# Patient Record
Sex: Male | Born: 1965 | Race: White | Hispanic: No | Marital: Married | Smoking: Never smoker
Health system: Southern US, Community
[De-identification: ages and names within clinical notes are randomized; demographics above are authoritative.]

## PROBLEM LIST (undated history)

## (undated) DIAGNOSIS — J40 Bronchitis, not specified as acute or chronic: Secondary | ICD-10-CM

## (undated) DIAGNOSIS — K219 Gastro-esophageal reflux disease without esophagitis: Secondary | ICD-10-CM

## (undated) DIAGNOSIS — E785 Hyperlipidemia, unspecified: Secondary | ICD-10-CM

## (undated) DIAGNOSIS — G4730 Sleep apnea, unspecified: Secondary | ICD-10-CM

## (undated) DIAGNOSIS — H6690 Otitis media, unspecified, unspecified ear: Secondary | ICD-10-CM

## (undated) DIAGNOSIS — G43909 Migraine, unspecified, not intractable, without status migrainosus: Secondary | ICD-10-CM

## (undated) DIAGNOSIS — F419 Anxiety disorder, unspecified: Secondary | ICD-10-CM

## (undated) DIAGNOSIS — Z9889 Other specified postprocedural states: Secondary | ICD-10-CM

## (undated) DIAGNOSIS — F32A Depression, unspecified: Secondary | ICD-10-CM

## (undated) DIAGNOSIS — I10 Essential (primary) hypertension: Secondary | ICD-10-CM

## (undated) HISTORY — PX: PILONIDAL CYST EXCISION: SHX744

## (undated) HISTORY — DX: Migraine, unspecified, not intractable, without status migrainosus: G43.909

---

## 2007-03-25 ENCOUNTER — Emergency Department (HOSPITAL_COMMUNITY): Admission: EM | Admit: 2007-03-25 | Discharge: 2007-03-25 | Payer: Self-pay | Admitting: Emergency Medicine

## 2007-12-03 ENCOUNTER — Emergency Department (HOSPITAL_COMMUNITY): Admission: EM | Admit: 2007-12-03 | Discharge: 2007-12-04 | Payer: Self-pay | Admitting: Emergency Medicine

## 2007-12-31 HISTORY — PX: FRACTURE SURGERY: SHX138

## 2008-03-05 ENCOUNTER — Inpatient Hospital Stay (HOSPITAL_COMMUNITY): Admission: EM | Admit: 2008-03-05 | Discharge: 2008-03-10 | Payer: Self-pay | Admitting: Emergency Medicine

## 2008-04-13 ENCOUNTER — Encounter: Admission: RE | Admit: 2008-04-13 | Discharge: 2008-07-12 | Payer: Self-pay | Admitting: Orthopedic Surgery

## 2010-06-26 ENCOUNTER — Encounter: Admission: RE | Admit: 2010-06-26 | Discharge: 2010-06-26 | Payer: Self-pay | Admitting: Gastroenterology

## 2010-11-02 ENCOUNTER — Ambulatory Visit
Admission: RE | Admit: 2010-11-02 | Discharge: 2010-11-02 | Payer: Self-pay | Source: Home / Self Care | Admitting: Orthopedic Surgery

## 2011-01-21 ENCOUNTER — Encounter: Payer: Self-pay | Admitting: Family Medicine
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
## 2011-09-23 LAB — CBC
HCT: 24.4 — ABNORMAL LOW
HCT: 24.8 — ABNORMAL LOW
Hemoglobin: 8.6 — ABNORMAL LOW
Hemoglobin: 8.6 — ABNORMAL LOW
MCHC: 35
MCHC: 35.1
MCHC: 35.4
MCV: 90.2
Platelets: 188
Platelets: 222
Platelets: 286
RBC: 2.72 — ABNORMAL LOW
RBC: 3.4 — ABNORMAL LOW
RBC: 4.35
RDW: 12.9
RDW: 12.9
RDW: 12.9
RDW: 13.1
WBC: 13.4 — ABNORMAL HIGH
WBC: 7.9

## 2011-09-23 LAB — BASIC METABOLIC PANEL
CO2: 27
Calcium: 7.7 — ABNORMAL LOW
Creatinine, Ser: 1.22
Sodium: 135

## 2011-09-23 LAB — CROSSMATCH: Antibody Screen: NEGATIVE

## 2011-09-23 LAB — I-STAT 8, (EC8 V) (CONVERTED LAB)
BUN: 10
Bicarbonate: 24.9 — ABNORMAL HIGH
Chloride: 102
HCT: 42
Hemoglobin: 14.3
Potassium: 3.9
TCO2: 26
pCO2, Ven: 42.4 — ABNORMAL LOW

## 2011-09-23 LAB — POCT I-STAT CREATININE: Operator id: 151321

## 2011-09-23 LAB — PROTIME-INR: INR: 1

## 2011-09-23 LAB — ABO/RH: ABO/RH(D): O NEG

## 2012-01-31 DIAGNOSIS — Z9889 Other specified postprocedural states: Secondary | ICD-10-CM

## 2012-01-31 HISTORY — DX: Other specified postprocedural states: Z98.890

## 2012-03-25 ENCOUNTER — Emergency Department (HOSPITAL_COMMUNITY)
Admission: EM | Admit: 2012-03-25 | Discharge: 2012-03-26 | Disposition: A | Discharge status: Home / Self Care | Payer: Managed Care, Other (non HMO) | Attending: Emergency Medicine | Admitting: Emergency Medicine

## 2012-03-25 ENCOUNTER — Emergency Department (HOSPITAL_COMMUNITY): Payer: Managed Care, Other (non HMO)

## 2012-03-25 ENCOUNTER — Encounter (HOSPITAL_COMMUNITY): Payer: Self-pay | Admitting: *Deleted

## 2012-03-25 DIAGNOSIS — R05 Cough: Secondary | ICD-10-CM | POA: Insufficient documentation

## 2012-03-25 DIAGNOSIS — J3489 Other specified disorders of nose and nasal sinuses: Secondary | ICD-10-CM | POA: Insufficient documentation

## 2012-03-25 DIAGNOSIS — R059 Cough, unspecified: Secondary | ICD-10-CM | POA: Insufficient documentation

## 2012-03-25 DIAGNOSIS — R0981 Nasal congestion: Secondary | ICD-10-CM

## 2012-03-25 DIAGNOSIS — R0602 Shortness of breath: Secondary | ICD-10-CM | POA: Insufficient documentation

## 2012-03-25 HISTORY — DX: Other specified postprocedural states: Z98.890

## 2012-03-25 HISTORY — DX: Bronchitis, not specified as acute or chronic: J40

## 2012-03-25 HISTORY — DX: Otitis media, unspecified, unspecified ear: H66.90

## 2012-03-25 LAB — POCT I-STAT, CHEM 8
Creatinine, Ser: 1.4 mg/dL — ABNORMAL HIGH (ref 0.50–1.35)
HCT: 47 % (ref 39.0–52.0)
Hemoglobin: 16 g/dL (ref 13.0–17.0)
Potassium: 4 mEq/L (ref 3.5–5.1)
Sodium: 138 mEq/L (ref 135–145)

## 2012-03-25 NOTE — ED Notes (Addendum)
C/o sob, worse when lying down, onset around 1900, LS CTA, speaking in clear complete sentences, "felt cold sx coming on for 2d", mentions sore throat & some nasal congestion & stomach does not feel well. (Denies: fever or nvd).  Sounds nasally congested with hoarse voice.

## 2012-03-26 LAB — CBC
MCH: 30.7 pg (ref 26.0–34.0)
Platelets: 299 10*3/uL (ref 150–400)
RBC: 4.89 MIL/uL (ref 4.22–5.81)
WBC: 9.2 10*3/uL (ref 4.0–10.5)

## 2012-03-26 LAB — DIFFERENTIAL
Eosinophils Absolute: 0.2 10*3/uL (ref 0.0–0.7)
Lymphocytes Relative: 30 % (ref 12–46)
Lymphs Abs: 2.8 10*3/uL (ref 0.7–4.0)
Neutrophils Relative %: 56 % (ref 43–77)

## 2012-03-26 MED ORDER — PREDNISONE 20 MG PO TABS
60.0000 mg | ORAL_TABLET | Freq: Once | ORAL | Status: AC
  Administered 2012-03-26: 60 mg via ORAL
  Filled 2012-03-26: qty 3

## 2012-03-26 MED ORDER — CETIRIZINE-PSEUDOEPHEDRINE ER 5-120 MG PO TB12: 1.0000 | ORAL_TABLET | Freq: Every day | ORAL | Status: AC

## 2012-03-26 MED ORDER — ALBUTEROL SULFATE HFA 108 (90 BASE) MCG/ACT IN AERS: 2.0000 | INHALATION_SPRAY | RESPIRATORY_TRACT | Status: AC | PRN

## 2012-03-26 NOTE — ED Provider Notes (Signed)
History     [name]: [id number]  Arrival date & time 03/25/12  2241   First MD Initiated Contact with Patient 03/26/12 0023      Chief Complaint  Patient presents with  . Shortness of Breath     HPI  History provided by the patient. Patient is a 46 year old male with no significant past medical history who presents with complaints of difficulty sleeping and awakening with cough and shortness of breath. She states that he began to feel some URI symptoms yesterday and today and this evening became more congested. No slight cough and states that when he is trying to fall asleep tonight he awakened shortly after falling asleep with feelings of shortness of breath and cough. Patient reports symptoms are slightly improved with sitting upright. Patient denies any significant cardiac history. He denies any swelling in extremities. He denies any persistent shortness of breath. He denies any chest pain or palpitations. he denies any fever, chills, sweats, nausea vomiting. Symptoms are described as severe. Patient denies any other aggravating or alleviating factors.    Past Medical History  Diagnosis Date  . Acute ear infection   . Bronchitis   . H/O removal of neck cyst 01/2012    R anterior neck I&D, with abx    Past Surgical History  Procedure Date  . Fracture surgery   . Pilonidal cyst excision     History reviewed. No pertinent family history.  History  Substance Use Topics  . Smoking status: Never Smoker   . Smokeless tobacco: Not on file  . Alcohol Use: No      Review of Systems  Constitutional: Negative for fever and chills.  Respiratory: Positive for cough and shortness of breath.   Cardiovascular: Negative for chest pain and palpitations.  Gastrointestinal: Negative for abdominal pain.    Allergies  Morphine and related and Penicillins  Home Medications   Current Outpatient Rx  Name Route Sig Dispense Refill  . ALBUTEROL SULFATE HFA 108 (90 BASE) MCG/ACT IN AERS  Inhalation Inhale 2 puffs into the lungs every 6 (six) hours as needed. For bronchitis    . AMITRIPTYLINE HCL 50 MG PO TABS Oral Take 50 mg by mouth at bedtime.    . ATORVASTATIN CALCIUM 20 MG PO TABS Oral Take 20 mg by mouth daily.    . BUPROPION HCL ER (SR) 100 MG PO TB12 Oral Take 100 mg by mouth 2 (two) times daily.    . DEXLANSOPRAZOLE 30 MG PO CPDR Oral Take 30 mg by mouth daily.    Marland Kitchen FLUTICASONE PROPIONATE 50 MCG/ACT NA SUSP Nasal Place 2 sprays into the nose daily. Ear and nasal congestion    . FENOFIBRATE 54 MG PO TABS Oral Take 54 mg by mouth daily.      BP 123/93  Pulse 73  Temp(Src) 97.5 F (36.4 C) (Oral)  Resp 14  SpO2 98%  Physical Exam  Nursing note and vitals reviewed. Constitutional: He is oriented to person, place, and time. He appears well-developed and well-nourished. No distress.  HENT:  Head: Normocephalic and atraumatic.  Mouth/Throat: Oropharynx is clear and moist.       Poor air movement through both nostrils. Edema of right nostril. Crusting of the nostrils.  Neck: Normal range of motion. Neck supple.  Cardiovascular: Normal rate and regular rhythm.   Pulmonary/Chest: Effort normal and breath sounds normal. No stridor. No respiratory distress. He has no wheezes. He has no rales.  Abdominal: Soft. He exhibits no distension. There is  no tenderness.  Musculoskeletal: He exhibits no edema and no tenderness.  Lymphadenopathy:    He has no cervical adenopathy.  Neurological: He is alert and oriented to person, place, and time.  Skin: Skin is warm. No rash noted.  Psychiatric: He has a normal mood and affect. His behavior is normal.    ED Course  Procedures   Results for orders placed during the hospital encounter of 03/25/12  CBC      Component Value Range   WBC 9.2  4.0 - 10.5 (K/uL)   RBC 4.89  4.22 - 5.81 (MIL/uL)   Hemoglobin 15.0  13.0 - 17.0 (g/dL)   HCT 16.1  09.6 - 04.5 (%)   MCV 88.3  78.0 - 100.0 (fL)   MCH 30.7  26.0 - 34.0 (pg)   MCHC  34.7  30.0 - 36.0 (g/dL)   RDW 40.9  81.1 - 91.4 (%)   Platelets 299  150 - 400 (K/uL)  DIFFERENTIAL      Component Value Range   Neutrophils Relative 56  43 - 77 (%)   Neutro Abs 5.1  1.7 - 7.7 (K/uL)   Lymphocytes Relative 30  12 - 46 (%)   Lymphs Abs 2.8  0.7 - 4.0 (K/uL)   Monocytes Relative 12  3 - 12 (%)   Monocytes Absolute 1.1 (*) 0.1 - 1.0 (K/uL)   Eosinophils Relative 2  0 - 5 (%)   Eosinophils Absolute 0.2  0.0 - 0.7 (K/uL)   Basophils Relative 1  0 - 1 (%)   Basophils Absolute 0.1  0.0 - 0.1 (K/uL)  POCT I-STAT, CHEM 8      Component Value Range   Sodium 138  135 - 145 (mEq/L)   Potassium 4.0  3.5 - 5.1 (mEq/L)   Chloride 102  96 - 112 (mEq/L)   BUN 17  6 - 23 (mg/dL)   Creatinine, Ser 7.82 (*) 0.50 - 1.35 (mg/dL)   Glucose, Bld 956 (*) 70 - 99 (mg/dL)   Calcium, Ion 2.13  0.86 - 1.32 (mmol/L)   TCO2 25  0 - 100 (mmol/L)   Hemoglobin 16.0  13.0 - 17.0 (g/dL)   HCT 57.8  46.9 - 62.9 (%)      Dg Chest 2 View  03/26/2012  *RADIOLOGY REPORT*  Clinical Data: Shortness of breath.  CHEST - 2 VIEW  Comparison: Chest radiograph performed 03/06/2008  Findings: The lungs are well-aerated and clear.  There is no evidence of focal opacification, pleural effusion or pneumothorax.  The heart is normal in size; the mediastinal contour is within normal limits.  No acute osseous abnormalities are seen.  IMPRESSION: No acute cardiopulmonary process seen.  Original Report Authenticated By: Tonia Ghent, M.D.     1. Nasal congestion       MDM  1:10 PM patient seen and evaluated. Patient no acute distress. Patient with normal respirations.  I discussed with patient the option to try Afrin for nasal congestion. Patient states he is afraid to not work for him he is tried and the past and does not want to try today. We also discussed possible breathing treatment the patient has no wheezing or significant respiratory complaints currently. At this time we'll give 1 dose of steroid and  recommend antihistamine and decongestant medication. Patient will plan to follow up with PCP.      Angus Seller, PA 03/26/12 1549  Medical screening examination/treatment/procedure(s) were performed by non-physician practitioner and as supervising physician I was immediately  available for consultation/collaboration.  Sunnie Nielsen, MD 03/27/12 [phone]

## 2012-03-26 NOTE — ED Notes (Signed)
Patient is AOx4 and comfortable with his discharge instructions. 

## 2012-03-26 NOTE — Discharge Instructions (Signed)
Please followup with your primary care provider later this week as discussed for continued evaluation and treatment of symptoms. You may find improvement of symptoms by sleeping in a recliner upright position. Return to the emergency room for any worsening shortness of breath, difficulty breathing or swallowing, fever, chills, sweats.   Upper Respiratory Infection, Adult An upper respiratory infection (URI) is also known as the common cold. It is often caused by a type of germ (virus). Colds are easily spread (contagious). You can pass it to others by kissing, coughing, sneezing, or drinking out of the same glass. Usually, you get better in 1 or 2 weeks.  HOME CARE   Only take medicine as told by your doctor.   Use a warm mist humidifier or breathe in steam from a hot shower.   Drink enough water and fluids to keep your pee (urine) clear or pale yellow.   Get plenty of rest.   Return to work when your temperature is back to normal or as told by your doctor. You may use a face mask and wash your hands to stop your cold from spreading.  GET HELP RIGHT AWAY IF:   After the first few days, you feel you are getting worse.   You have questions about your medicine.   You have chills, shortness of breath, or brown or red spit (mucus).   You have yellow or brown snot (nasal discharge) or pain in the face, especially when you bend forward.   You have a fever, puffy (swollen) neck, pain when you swallow, or white spots in the back of your throat.   You have a bad headache, ear pain, sinus pain, or chest pain.   You have a high-pitched whistling sound when you breathe in and out (wheezing).   You have a lasting cough or cough up blood.   You have sore muscles or a stiff neck.  MAKE SURE YOU:   Understand these instructions.   Will watch your condition.   Will get help right away if you are not doing well or get worse.  Document Released: 06/03/2008 Document Revised: 12/05/2011 Document  Reviewed: 04/22/2011 [facility] Patient Information 2012 [place], [place].  RESOURCE GUIDE  Dental Problems  Patients with Medicaid: [facility] [phone] [address]                                           [phone] [address] Phone:  [phone]                                                  Phone:  [phone]  If unable to pay or uninsured, contact:  [facility] or [facility] to become qualified for the adult dental clinic.  Chronic Pain Problems Contact [facility]  [phone] Patients need to be referred by their primary care doctor.  Insufficient Money for Medicine Contact [employer]:  call "211" or [facility] [phone] [phone].  No Primary Care Doctor Call [facility]  [phone] Other agencies that provide inexpensive medical care    [facility]  [phone]    [facility]  [phone]    [facility]  [phone]    [facility]  [phone]    [facility]  [phone]    [facility]  [phone]  Psychological Services [facility]  [phone] [facility]  [phone] [facility]   [phone] (emergency services [phone])  Substance Abuse Resources [facility]  [phone] [facility] [phone] [facility] [phone] Floydene Flock [phone] Residential & Outpatient Substance Abuse Program  [phone]  Abuse/Neglect [facility] Child Abuse Hotline (615) 461-2438 [facility] Child Abuse Hotline 252-248-8133 (After Hours)  Emergency Shelter [facility] 810-423-5409  [facility] [phone] [facility] [phone]  MRSA Hotline #:   402-585-6638    [facility]  [facility]     [employer]                          [facility]  De[address]. [address] [place]                       [address]      [address]  Blondell Reveal Phone:  [phone]                                   Phone:  [phone]                 Phone:  [phone]  [facility] Phone:  [phone]  [facility] Child Abuse Hotline 321-496-6019 [phone] (After Hours)

## 2014-06-24 ENCOUNTER — Ambulatory Visit
Admission: RE | Admit: 2014-06-24 | Discharge: 2014-06-24 | Disposition: A | Discharge status: Home / Self Care | Payer: Managed Care, Other (non HMO) | Source: Ambulatory Visit | Attending: Emergency Medicine | Admitting: Emergency Medicine

## 2014-06-24 ENCOUNTER — Other Ambulatory Visit: Payer: Self-pay | Admitting: Emergency Medicine

## 2014-06-24 DIAGNOSIS — M25551 Pain in right hip: Secondary | ICD-10-CM

## 2014-06-24 DIAGNOSIS — M545 Low back pain, unspecified: Secondary | ICD-10-CM

## 2016-05-09 ENCOUNTER — Ambulatory Visit: Payer: Self-pay | Admitting: Otolaryngology

## 2016-05-10 ENCOUNTER — Encounter (HOSPITAL_BASED_OUTPATIENT_CLINIC_OR_DEPARTMENT_OTHER): Payer: Self-pay | Admitting: *Deleted

## 2016-05-10 NOTE — H&P (Signed)
Otolaryngology Office Note  HPI:   Jim OlszewskiRobert Smith is a 50 y.o. male who presents as a consult patient with a chief complaint of Right side floor of mouth swelling. He has had this intermittently for about 3 months. It will swell up, and then eventually drained sour liquid material. He hasn't really had much pain except when he gets large enough that he starts to bite it. There is no antecedent trauma or infection in the area. His dental evaluation has been negative. He has a history of reflux and has been on medication for years. He drinks at least 4 servings of caffeine daily. He has been having thick postnasal drainage recently especially at night.   PMH/Meds/All/SocHx/FamHx/ROS:    Past Medical History       Past Medical History:  Diagnosis Date  . High cholesterol   . OSA (obstructive sleep apnea)        Past Surgical History        Past Surgical History:  Procedure Laterality Date  . LEG SURGERY    . WISDOM TOOTH EXTRACTION        No family history of bleeding disorders, wound healing problems or difficulty with anesthesia.    Social History   Social History        Social History  . Marital status: Married    Spouse name: N/A  . Number of children: N/A  . Years of education: N/A      Occupational History  . Not on file.       Social History Main Topics  . Smoking status: Never Smoker  . Smokeless tobacco: Never Used  . Alcohol use No  . Drug use: No  . Sexual activity: Not on file       Other Topics Concern  . Not on file      Social History Narrative  . No narrative on file       Current Outpatient Prescriptions:  . amitriptyline (ELAVIL) 10 MG tablet, TAKE 1 TABLET DAILY, Disp: , Rfl: 2 . atorvastatin (LIPITOR) 10 MG tablet, TAKE 1 TABLET IN THE EVENING, Disp: , Rfl: 0 . buPROPion (WELLBUTRIN SR) 200 MG 12 hr tablet, TAKE ONE TABLET (200 MG TOTAL) BY MOUTH 2 (TWO) TIMES DAILY., Disp: , Rfl: 3 . DEXILANT 60 mg capsule,  TAKE ONE CAPSULE BY MOUTH EVERY DAY, Disp: , Rfl: 2 . fenofibrate (LOFIBRA) 160 MG tablet, TAKE 1 TABLET DAILY, Disp: , Rfl: 1  A complete ROS was performed with pertinent positives/negatives noted in the HPI. The remainder of the ROS are negative.   Physical Exam:    BP 130/81  Ht 1.88 m (6\' 2" )  Wt 98.9 kg (218 lb)  BMI 27.99 kg/m2   General: Healthy and alert, in no distress, breathing easily. Normal affect. In a pleasant mood. Head: Normocephalic, atraumatic. No masses, or scars. Eyes: Pupils are equal, and reactive to light. Vision is grossly intact. No spontaneous or gaze nystagmus. Ears: Ear canals are clear. Tympanic membranes are intact, with normal landmarks and the middle ears are clear and healthy. Hearing: Grossly normal. Nose. Nasal cavities are clear with healthy mucosa, no polyps or exudate.Airways are patent. Face: No masses or scars, facial nerve function is symmetric. Oral Cavity: Soft liquidy swelling right floor of mouth. Does not involve the submandibular gland. Tongue with normal mobility. Dentition appears healthy. Oropharynx: Tonsils are symmetric. There are no mucosal masses identified. Tongue base appears normal and healthy. Larynx/Hypopharynx: deferred Neck: No palpable masses, no cervical  adenopathy, no thyroid nodules or enlargement. Neuro: Cranial nerves II-XII will normal function. Balance: Normal gate. Other findings: none.   Independent Review of Additional Tests or Records:  none  Procedures:  none   Impression & Plans:  Jim Smith is a 50 y.o. male with Ranula. Recommend surgical excision of the right sublingual gland. This is typically curative for this problem. This will be done as an outpatient procedure under anesthesia. He is also suffering with reflux.

## 2016-05-17 ENCOUNTER — Ambulatory Visit (HOSPITAL_BASED_OUTPATIENT_CLINIC_OR_DEPARTMENT_OTHER)
Admission: RE | Admit: 2016-05-17 | Discharge: 2016-05-17 | Disposition: A | Discharge status: Home / Self Care | Payer: Managed Care, Other (non HMO) | Source: Ambulatory Visit | Attending: Otolaryngology | Admitting: Otolaryngology

## 2016-05-17 ENCOUNTER — Ambulatory Visit (HOSPITAL_BASED_OUTPATIENT_CLINIC_OR_DEPARTMENT_OTHER): Payer: Managed Care, Other (non HMO) | Admitting: Anesthesiology

## 2016-05-17 ENCOUNTER — Encounter (HOSPITAL_BASED_OUTPATIENT_CLINIC_OR_DEPARTMENT_OTHER)
Admission: RE | Disposition: A | Discharge status: Home / Self Care | Payer: Self-pay | Source: Ambulatory Visit | Attending: Otolaryngology

## 2016-05-17 ENCOUNTER — Encounter (HOSPITAL_BASED_OUTPATIENT_CLINIC_OR_DEPARTMENT_OTHER): Payer: Self-pay | Admitting: *Deleted

## 2016-05-17 DIAGNOSIS — E7800 Pure hypercholesterolemia, unspecified: Secondary | ICD-10-CM | POA: Diagnosis not present

## 2016-05-17 DIAGNOSIS — Z79899 Other long term (current) drug therapy: Secondary | ICD-10-CM | POA: Diagnosis not present

## 2016-05-17 DIAGNOSIS — K134 Granuloma and granuloma-like lesions of oral mucosa: Secondary | ICD-10-CM | POA: Insufficient documentation

## 2016-05-17 DIAGNOSIS — G4733 Obstructive sleep apnea (adult) (pediatric): Secondary | ICD-10-CM | POA: Insufficient documentation

## 2016-05-17 DIAGNOSIS — G4730 Sleep apnea, unspecified: Secondary | ICD-10-CM | POA: Insufficient documentation

## 2016-05-17 HISTORY — DX: Sleep apnea, unspecified: G47.30

## 2016-05-17 HISTORY — DX: Hyperlipidemia, unspecified: E78.5

## 2016-05-17 HISTORY — DX: Anxiety disorder, unspecified: F41.9

## 2016-05-17 HISTORY — PX: SUBMANDIBULAR GLAND EXCISION: SHX2456

## 2016-05-17 HISTORY — DX: Gastro-esophageal reflux disease without esophagitis: K21.9

## 2016-05-17 SURGERY — EXCISION, SUBMANDIBULAR GLAND
Anesthesia: General | Laterality: Right

## 2016-05-17 MED ORDER — LIDOCAINE-EPINEPHRINE 1 %-1:100000 IJ SOLN
INTRAMUSCULAR | Status: DC | PRN
  Administered 2016-05-17: 2 mL

## 2016-05-17 MED ORDER — LIDOCAINE 2% (20 MG/ML) 5 ML SYRINGE
INTRAMUSCULAR | Status: AC
  Filled 2016-05-17: qty 5

## 2016-05-17 MED ORDER — ACETAMINOPHEN 325 MG PO TABS
650.0000 mg | ORAL_TABLET | Freq: Four times a day (QID) | ORAL | Status: DC | PRN
  Administered 2016-05-17: 650 mg via ORAL

## 2016-05-17 MED ORDER — FENTANYL CITRATE (PF) 100 MCG/2ML IJ SOLN
INTRAMUSCULAR | Status: AC
  Filled 2016-05-17: qty 2

## 2016-05-17 MED ORDER — SUCCINYLCHOLINE CHLORIDE 200 MG/10ML IV SOSY
PREFILLED_SYRINGE | INTRAVENOUS | Status: AC
  Filled 2016-05-17: qty 10

## 2016-05-17 MED ORDER — PROMETHAZINE HCL 25 MG/ML IJ SOLN
INTRAMUSCULAR | Status: AC
  Filled 2016-05-17: qty 1

## 2016-05-17 MED ORDER — HYDROMORPHONE HCL 1 MG/ML IJ SOLN
INTRAMUSCULAR | Status: AC
  Filled 2016-05-17: qty 1

## 2016-05-17 MED ORDER — MIDAZOLAM HCL 2 MG/2ML IJ SOLN
1.0000 mg | INTRAMUSCULAR | Status: DC | PRN
  Administered 2016-05-17: 2 mg via INTRAVENOUS

## 2016-05-17 MED ORDER — MIDAZOLAM HCL 2 MG/2ML IJ SOLN
INTRAMUSCULAR | Status: AC
  Filled 2016-05-17: qty 2

## 2016-05-17 MED ORDER — PHENYLEPHRINE 40 MCG/ML (10ML) SYRINGE FOR IV PUSH (FOR BLOOD PRESSURE SUPPORT)
PREFILLED_SYRINGE | INTRAVENOUS | Status: AC
  Filled 2016-05-17: qty 10

## 2016-05-17 MED ORDER — SUCCINYLCHOLINE CHLORIDE 20 MG/ML IJ SOLN
INTRAMUSCULAR | Status: DC | PRN
  Administered 2016-05-17: 100 mg via INTRAVENOUS

## 2016-05-17 MED ORDER — HYDROMORPHONE HCL 1 MG/ML IJ SOLN
0.2500 mg | INTRAMUSCULAR | Status: DC | PRN
  Administered 2016-05-17 (×4): 0.5 mg via INTRAVENOUS

## 2016-05-17 MED ORDER — LACTATED RINGERS IV SOLN
INTRAVENOUS | Status: DC
  Administered 2016-05-17: 10:00:00 via INTRAVENOUS
  Administered 2016-05-17: 10 mL/h via INTRAVENOUS

## 2016-05-17 MED ORDER — SCOPOLAMINE 1 MG/3DAYS TD PT72
MEDICATED_PATCH | TRANSDERMAL | Status: AC
  Filled 2016-05-17: qty 1

## 2016-05-17 MED ORDER — PHENYLEPHRINE 40 MCG/ML (10ML) SYRINGE FOR IV PUSH (FOR BLOOD PRESSURE SUPPORT)
PREFILLED_SYRINGE | INTRAVENOUS | Status: DC | PRN
Start: 2016-05-17 — End: 2016-05-17
  Administered 2016-05-17: 80 ug via INTRAVENOUS

## 2016-05-17 MED ORDER — FENTANYL CITRATE (PF) 100 MCG/2ML IJ SOLN
INTRAMUSCULAR | Status: AC
Start: 2016-05-17 — End: 2016-05-17
  Filled 2016-05-17: qty 2

## 2016-05-17 MED ORDER — SUGAMMADEX SODIUM 200 MG/2ML IV SOLN
INTRAVENOUS | Status: DC | PRN
  Administered 2016-05-17: 200 mg via INTRAVENOUS

## 2016-05-17 MED ORDER — GLYCOPYRROLATE 0.2 MG/ML IJ SOLN: 0.2000 mg | Freq: Once | INTRAMUSCULAR | Status: DC | PRN

## 2016-05-17 MED ORDER — OXYCODONE HCL 5 MG PO TABS
5.0000 mg | ORAL_TABLET | Freq: Once | ORAL | Status: AC | PRN
  Administered 2016-05-17: 5 mg via ORAL

## 2016-05-17 MED ORDER — CLINDAMYCIN HCL 300 MG PO CAPS: 300.0000 mg | ORAL_CAPSULE | Freq: Three times a day (TID) | ORAL | Status: DC

## 2016-05-17 MED ORDER — ONDANSETRON HCL 4 MG/2ML IJ SOLN
INTRAMUSCULAR | Status: DC | PRN
  Administered 2016-05-17: 4 mg via INTRAVENOUS

## 2016-05-17 MED ORDER — ROCURONIUM 10MG/ML (10ML) SYRINGE FOR MEDFUSION PUMP - OPTIME
INTRAVENOUS | Status: DC | PRN
  Administered 2016-05-17: 20 mg via INTRAVENOUS
  Administered 2016-05-17: 30 mg via INTRAVENOUS

## 2016-05-17 MED ORDER — MEPERIDINE HCL 25 MG/ML IJ SOLN: 6.2500 mg | INTRAMUSCULAR | Status: DC | PRN

## 2016-05-17 MED ORDER — ONDANSETRON HCL 4 MG/2ML IJ SOLN
INTRAMUSCULAR | Status: AC
  Filled 2016-05-17: qty 2

## 2016-05-17 MED ORDER — PROPOFOL 10 MG/ML IV BOLUS
INTRAVENOUS | Status: DC | PRN
  Administered 2016-05-17: 200 mg via INTRAVENOUS

## 2016-05-17 MED ORDER — PROMETHAZINE HCL 25 MG/ML IJ SOLN
6.2500 mg | Freq: Once | INTRAMUSCULAR | Status: AC | PRN
  Administered 2016-05-17: 6.25 mg via INTRAVENOUS

## 2016-05-17 MED ORDER — SCOPOLAMINE 1 MG/3DAYS TD PT72
1.0000 | MEDICATED_PATCH | Freq: Once | TRANSDERMAL | Status: DC | PRN
  Administered 2016-05-17: 1.5 mg via TRANSDERMAL

## 2016-05-17 MED ORDER — CLINDAMYCIN PHOSPHATE 900 MG/50ML IV SOLN
900.0000 mg | INTRAVENOUS | Status: AC
  Administered 2016-05-17: 900 mg via INTRAVENOUS

## 2016-05-17 MED ORDER — DEXAMETHASONE SODIUM PHOSPHATE 10 MG/ML IJ SOLN
INTRAMUSCULAR | Status: AC
  Filled 2016-05-17: qty 1

## 2016-05-17 MED ORDER — OXYCODONE HCL 5 MG/5ML PO SOLN: 5.0000 mg | Freq: Once | ORAL | Status: AC | PRN

## 2016-05-17 MED ORDER — FENTANYL CITRATE (PF) 100 MCG/2ML IJ SOLN
50.0000 ug | INTRAMUSCULAR | Status: DC | PRN
  Administered 2016-05-17: 50 ug via INTRAVENOUS
  Administered 2016-05-17: 100 ug via INTRAVENOUS

## 2016-05-17 MED ORDER — SUGAMMADEX SODIUM 200 MG/2ML IV SOLN
INTRAVENOUS | Status: AC
  Filled 2016-05-17: qty 2

## 2016-05-17 MED ORDER — ACETAMINOPHEN 325 MG PO TABS
ORAL_TABLET | ORAL | Status: AC
  Filled 2016-05-17: qty 2

## 2016-05-17 MED ORDER — HYDROCODONE-ACETAMINOPHEN 7.5-325 MG PO TABS
1.0000 | ORAL_TABLET | Freq: Four times a day (QID) | ORAL | Status: DC | PRN
Start: 2016-05-17 — End: 2017-08-04

## 2016-05-17 MED ORDER — PROMETHAZINE HCL 25 MG RE SUPP: 25.0000 mg | Freq: Four times a day (QID) | RECTAL | Status: DC | PRN

## 2016-05-17 MED ORDER — OXYCODONE HCL 5 MG PO TABS
ORAL_TABLET | ORAL | Status: AC
  Filled 2016-05-17: qty 1

## 2016-05-17 MED ORDER — LIDOCAINE HCL (CARDIAC) 20 MG/ML IV SOLN
INTRAVENOUS | Status: DC | PRN
  Administered 2016-05-17: 80 mg via INTRAVENOUS

## 2016-05-17 MED ORDER — DEXAMETHASONE SODIUM PHOSPHATE 4 MG/ML IJ SOLN
INTRAMUSCULAR | Status: DC | PRN
  Administered 2016-05-17: 10 mg via INTRAVENOUS

## 2016-05-17 MED ORDER — PROPOFOL 10 MG/ML IV BOLUS
INTRAVENOUS | Status: AC
  Filled 2016-05-17: qty 20

## 2016-05-17 MED ORDER — CLINDAMYCIN PHOSPHATE 900 MG/50ML IV SOLN
INTRAVENOUS | Status: AC
  Filled 2016-05-17: qty 50

## 2016-05-17 MED ORDER — ATROPINE SULFATE 0.4 MG/ML IJ SOLN
INTRAMUSCULAR | Status: AC
  Filled 2016-05-17: qty 1

## 2016-05-17 SURGICAL SUPPLY — 62 items
ADH SKN CLS APL DERMABOND .7 (GAUZE/BANDAGES/DRESSINGS)
APL SKNCLS STERI-STRIP NONHPOA (GAUZE/BANDAGES/DRESSINGS)
ATTRACTOMAT 16X20 MAGNETIC DRP (DRAPES) IMPLANT
BENZOIN TINCTURE PRP APPL 2/3 (GAUZE/BANDAGES/DRESSINGS) IMPLANT
BLADE SURG 15 STRL LF DISP TIS (BLADE) ×1 IMPLANT
BLADE SURG 15 STRL SS (BLADE) ×2
CANISTER SUCT 1200ML W/VALVE (MISCELLANEOUS) ×2 IMPLANT
CLEANER CAUTERY TIP 5X5 PAD (MISCELLANEOUS) ×1 IMPLANT
CLIP TI MEDIUM 6 (CLIP) IMPLANT
CLIP TI WIDE RED SMALL 6 (CLIP) IMPLANT
CORDS BIPOLAR (ELECTRODE) ×1 IMPLANT
COVER BACK TABLE 60X90IN (DRAPES) ×2 IMPLANT
COVER MAYO STAND STRL (DRAPES) ×2 IMPLANT
DERMABOND ADVANCED (GAUZE/BANDAGES/DRESSINGS)
DERMABOND ADVANCED .7 DNX12 (GAUZE/BANDAGES/DRESSINGS) IMPLANT
DRAIN JACKSON RD 7FR 3/32 (WOUND CARE) IMPLANT
DRAIN PENROSE 1/4X12 LTX STRL (WOUND CARE) IMPLANT
DRAPE MICROSCOPE URBAN (DRAPES) ×1 IMPLANT
DRAPE U-SHAPE 76X120 STRL (DRAPES) ×1 IMPLANT
ELECT COATED BLADE 2.86 ST (ELECTRODE) ×2 IMPLANT
ELECT REM PT RETURN 9FT ADLT (ELECTROSURGICAL) ×2
ELECTRODE REM PT RTRN 9FT ADLT (ELECTROSURGICAL) ×1 IMPLANT
EVACUATOR SILICONE 100CC (DRAIN) IMPLANT
FORCEPS BIPOLAR SPETZLER 8 1.0 (NEUROSURGERY SUPPLIES) ×1 IMPLANT
GAUZE SPONGE 4X4 16PLY XRAY LF (GAUZE/BANDAGES/DRESSINGS) IMPLANT
GLOVE BIO SURGEON STRL SZ 6 (GLOVE) ×1 IMPLANT
GLOVE BIOGEL PI IND STRL 6.5 (GLOVE) IMPLANT
GLOVE BIOGEL PI IND STRL 7.0 (GLOVE) IMPLANT
GLOVE BIOGEL PI INDICATOR 6.5 (GLOVE) ×1
GLOVE BIOGEL PI INDICATOR 7.0 (GLOVE) ×1
GLOVE ECLIPSE 6.5 STRL STRAW (GLOVE) ×2 IMPLANT
GLOVE ECLIPSE 7.5 STRL STRAW (GLOVE) ×2 IMPLANT
GOWN STRL REUS W/ TWL LRG LVL3 (GOWN DISPOSABLE) ×1 IMPLANT
GOWN STRL REUS W/TWL LRG LVL3 (GOWN DISPOSABLE) ×6
NDL HYPO 25X1 1.5 SAFETY (NEEDLE) IMPLANT
NDL PRECISIONGLIDE 27X1.5 (NEEDLE) IMPLANT
NEEDLE HYPO 25X1 1.5 SAFETY (NEEDLE) IMPLANT
NEEDLE PRECISIONGLIDE 27X1.5 (NEEDLE) ×2 IMPLANT
NS IRRIG 1000ML POUR BTL (IV SOLUTION) ×1 IMPLANT
PACK BASIN DAY SURGERY FS (CUSTOM PROCEDURE TRAY) ×2 IMPLANT
PAD CLEANER CAUTERY TIP 5X5 (MISCELLANEOUS) ×1
PENCIL FOOT CONTROL (ELECTRODE) ×2 IMPLANT
RUBBERBAND STERILE (MISCELLANEOUS) IMPLANT
SHEET MEDIUM DRAPE 40X70 STRL (DRAPES) ×1 IMPLANT
SLEEVE SCD COMPRESS KNEE MED (MISCELLANEOUS) IMPLANT
SPONGE GAUZE 2X2 8PLY STRL LF (GAUZE/BANDAGES/DRESSINGS) IMPLANT
SPONGE GAUZE 4X4 12PLY STER LF (GAUZE/BANDAGES/DRESSINGS) IMPLANT
STAPLER VISISTAT 35W (STAPLE) IMPLANT
STRIP CLOSURE SKIN 1/2X4 (GAUZE/BANDAGES/DRESSINGS) IMPLANT
SUCTION FRAZIER HANDLE 10FR (MISCELLANEOUS)
SUCTION TUBE FRAZIER 10FR DISP (MISCELLANEOUS) IMPLANT
SUT CHROMIC 3 0 PS 2 (SUTURE) IMPLANT
SUT CHROMIC 4 0 P 3 18 (SUTURE) ×1 IMPLANT
SUT ETHILON 4 0 PS 2 18 (SUTURE) IMPLANT
SUT ETHILON 5 0 P 3 18 (SUTURE)
SUT NYLON ETHILON 5-0 P-3 1X18 (SUTURE) IMPLANT
SUT PLAIN 5 0 P 3 18 (SUTURE) ×1 IMPLANT
SUT SILK 4 0 TIES 17X18 (SUTURE) IMPLANT
SYR BULB 3OZ (MISCELLANEOUS) ×1 IMPLANT
SYR CONTROL 10ML LL (SYRINGE) ×1 IMPLANT
TRAY DSU PREP LF (CUSTOM PROCEDURE TRAY) ×1 IMPLANT
TUBE CONNECTING 20X1/4 (TUBING) ×2 IMPLANT

## 2016-05-17 NOTE — Anesthesia Postprocedure Evaluation (Signed)
Anesthesia Post Note  Patient: Jim OlszewskiRobert Bolls  Procedure(s) Performed: Procedure(s) (LRB): INTRA ORAL EXCISION RIGHT SUBLINGUAL GLAND (Right)  Patient location during evaluation: PACU Anesthesia Type: General Level of consciousness: awake and alert Pain management: pain level controlled Vital Signs Assessment: post-procedure vital signs reviewed and stable Respiratory status: spontaneous breathing, nonlabored ventilation and respiratory function stable Cardiovascular status: blood pressure returned to baseline and stable Postop Assessment: no signs of nausea or vomiting Anesthetic complications: no    Last Vitals:  Filed Vitals:   05/17/16 1215 05/17/16 1245  BP: 127/73 146/71  Pulse: 74 74  Temp:  36.7 C  Resp: 18 16    Last Pain:  Filed Vitals:   05/17/16 1256  PainSc: 4                  Chaze Hruska A

## 2016-05-17 NOTE — Anesthesia Preprocedure Evaluation (Signed)
Anesthesia Evaluation  Patient identified by MRN, date of birth, ID band Patient awake    Reviewed: Allergy & Precautions, NPO status , Patient's Chart, lab work & pertinent test results  Airway Mallampati: I  TM Distance: >3 FB Neck ROM: Full    Dental  (+) Teeth Intact, Dental Advisory Given   Pulmonary sleep apnea and Continuous Positive Airway Pressure Ventilation ,    breath sounds clear to auscultation       Cardiovascular  Rhythm:Regular Rate:Normal     Neuro/Psych    GI/Hepatic   Endo/Other    Renal/GU      Musculoskeletal   Abdominal   Peds  Hematology   Anesthesia Other Findings   Reproductive/Obstetrics                             Anesthesia Physical Anesthesia Plan  ASA: II  Anesthesia Plan: General   Post-op Pain Management:    Induction: Intravenous  Airway Management Planned: Oral ETT  Additional Equipment:   Intra-op Plan:   Post-operative Plan: Extubation in OR  Informed Consent: I have reviewed the patients History and Physical, chart, labs and discussed the procedure including the risks, benefits and alternatives for the proposed anesthesia with the patient or authorized representative who has indicated his/her understanding and acceptance.   Dental advisory given  Plan Discussed with: CRNA, Anesthesiologist and Surgeon  Anesthesia Plan Comments:         Anesthesia Quick Evaluation

## 2016-05-17 NOTE — Anesthesia Procedure Notes (Signed)
Procedure Name: Intubation Date/Time: 05/17/2016 9:18 AM Performed by: Jim GibbonKEETON, Jim Braddock S Pre-anesthesia Checklist: Patient identified, Emergency Drugs available, Suction available and Patient being monitored Patient Re-evaluated:Patient Re-evaluated prior to inductionOxygen Delivery Method: Circle System Utilized Preoxygenation: Pre-oxygenation with 100% oxygen Intubation Type: IV induction Ventilation: Mask ventilation without difficulty Laryngoscope Size: Miller and 3 Grade View: Grade III Tube type: Oral Tube size: 8.0 mm Number of attempts: 1 Airway Equipment and Method: Stylet and Oral airway Placement Confirmation: ETT inserted through vocal cords under direct vision,  positive ETCO2 and breath sounds checked- equal and bilateral Tube secured with: Tape Dental Injury: Teeth and Oropharynx as per pre-operative assessment

## 2016-05-17 NOTE — Discharge Instructions (Signed)
Rinse mouth with saline 3 times daily. Resume regular diet and brush teeth as you would normally would.  Call your surgeon if you experience:   1.  Fever over 101.0. 2.  Inability to urinate. 3.  Nausea and/or vomiting. 4.  Extreme swelling or bruising at the surgical site. 5.  Continued bleeding from the incision. 6.  Increased pain, redness or drainage from the incision. 7.  Problems related to your pain medication. 8.  Any problems and/or concerns   Post Anesthesia Home Care Instructions  Activity: Get plenty of rest for the remainder of the day. A responsible adult should stay with you for 24 hours following the procedure.  For the next 24 hours, DO NOT: -Drive a car -Advertising copywriterperate machinery -Drink alcoholic beverages -Take any medication unless instructed by your physician -Make any legal decisions or sign important papers.  Meals: Start with liquid foods such as gelatin or soup. Progress to regular foods as tolerated. Avoid greasy, spicy, heavy foods. If nausea and/or vomiting occur, drink only clear liquids until the nausea and/or vomiting subsides. Call your physician if vomiting continues.  Special Instructions/Symptoms: Your throat may feel dry or sore from the anesthesia or the breathing tube placed in your throat during surgery. If this causes discomfort, gargle with warm salt water. The discomfort should disappear within 24 hours.  If you had a scopolamine patch placed behind your ear for the management of post- operative nausea and/or vomiting:  1. The medication in the patch is effective for 72 hours, after which it should be removed.  Wrap patch in a tissue and discard in the trash. Wash hands thoroughly with soap and water. 2. You may remove the patch earlier than 72 hours if you experience unpleasant side effects which may include dry mouth, dizziness or visual disturbances. 3. Avoid touching the patch. Wash your hands with soap and water after contact with the patch.

## 2016-05-17 NOTE — Transfer of Care (Signed)
Immediate Anesthesia Transfer of Care Note  Patient: Jim Smith  Procedure(s) Performed: Procedure(s): EXCISION RIGHT SUBLINGUAL GLAND (Right)  Patient Location: PACU  Anesthesia Type:General  Level of Consciousness: sedated and responds to stimulation  Airway & Oxygen Therapy: Patient Spontanous Breathing and Patient connected to face mask oxygen  Post-op Assessment: Report given to RN and Post -op Vital signs reviewed and stable  Post vital signs: Reviewed and stable  Last Vitals:  Filed Vitals:   05/17/16 0828 05/17/16 1057  BP: 144/86   Pulse: 66 77  Temp: 36.5 C   Resp: 18 11    Last Pain: There were no vitals filed for this visit.       Complications: No apparent anesthesia complications

## 2016-05-17 NOTE — Op Note (Signed)
OPERATIVE REPORT  DATE OF SURGERY: 05/17/2016  PATIENT:  Lula Olszewskiobert Mazzeo,  50 y.o. male  PRE-OPERATIVE DIAGNOSIS:  granuloma on floor of mouth  POST-OPERATIVE DIAGNOSIS:  granuloma on floor of mouth  PROCEDURE:  Procedure(s): EXCISION RIGHT SUBLINGUAL GLAND  SURGEON:  Susy FrizzleJefry H Kevontay Burks, MD  ASSISTANTS: None  ANESTHESIA:   General   EBL:  10 ml  DRAINS: None  LOCAL MEDICATIONS USED:  1% Xylocaine with epinephrine  SPECIMEN:  Right sublingual gland  COUNTS:  Correct  PROCEDURE DETAILS: The patient was taken to the operating room and placed on the operating table in the supine position. Following induction of general endotracheal anesthesia, the mouth was draped in a standard fashion. A left sided bite-block was used. Local anesthetic was infiltrated into the floor of mouth on the right side. Using the operating microscope, the submandibular puncta was cannulated and dilated up to a #4 lacrimal probe. Electrocautery was used to create a linear incision along the lateral floor of mouth on the right. The sublingual gland was exposed. Sharp and cautery dissection was then used to dissected around the sublingual gland to remove the entire gland. The submandibular duct was rerouted and the edges were reapproximated to the new coastal incision in 4 quadrants. Bipolar cautery was used for hemostasis. The lingual nerve was identified and preserved. The incision was reapproximated with running chromic suture. 5-0 plain gut was used to reapproximate the edges of the submandibular duct. The mouth was irrigated with saline and suctioned. The patient was awakened, extubated and transferred to recovery in stable condition.    PATIENT DISPOSITION:  To PACU, stable

## 2016-05-17 NOTE — Interval H&P Note (Signed)
History and Physical Interval Note:  05/17/2016 8:42 AM  Jim Smith  has presented today for surgery, with the diagnosis of granuloma on floor of mouth  The various methods of treatment have been discussed with the patient and family. After consideration of risks, benefits and other options for treatment, the patient has consented to  Procedure(s): EXCISION SUBlingual GLAND (N/A) as a surgical intervention .  The patient's history has been reviewed, patient examined, no change in status, stable for surgery.  I have reviewed the patient's chart and labs.  Questions were answered to the patient's satisfaction.     Dynasty Holquin

## 2016-05-19 ENCOUNTER — Encounter (HOSPITAL_COMMUNITY): Payer: Self-pay | Admitting: *Deleted

## 2016-05-19 ENCOUNTER — Observation Stay (HOSPITAL_COMMUNITY)
Admission: EM | Admit: 2016-05-19 | Discharge: 2016-05-20 | Disposition: A | Discharge status: Home / Self Care | Payer: Managed Care, Other (non HMO) | Attending: Otolaryngology | Admitting: Otolaryngology

## 2016-05-19 DIAGNOSIS — K116 Mucocele of salivary gland: Secondary | ICD-10-CM | POA: Diagnosis present

## 2016-05-19 DIAGNOSIS — R1310 Dysphagia, unspecified: Secondary | ICD-10-CM | POA: Insufficient documentation

## 2016-05-19 DIAGNOSIS — G8918 Other acute postprocedural pain: Principal | ICD-10-CM | POA: Insufficient documentation

## 2016-05-19 DIAGNOSIS — E785 Hyperlipidemia, unspecified: Secondary | ICD-10-CM | POA: Diagnosis not present

## 2016-05-19 DIAGNOSIS — R609 Edema, unspecified: Secondary | ICD-10-CM | POA: Insufficient documentation

## 2016-05-19 DIAGNOSIS — G4730 Sleep apnea, unspecified: Secondary | ICD-10-CM | POA: Insufficient documentation

## 2016-05-19 DIAGNOSIS — Z79899 Other long term (current) drug therapy: Secondary | ICD-10-CM | POA: Diagnosis not present

## 2016-05-19 MED ORDER — DIPHENHYDRAMINE HCL 50 MG/ML IJ SOLN
25.0000 mg | Freq: Once | INTRAMUSCULAR | Status: AC
  Administered 2016-05-19: 25 mg via INTRAVENOUS
  Filled 2016-05-19: qty 1

## 2016-05-19 MED ORDER — IBUPROFEN 200 MG PO TABS
200.0000 mg | ORAL_TABLET | ORAL | Status: DC | PRN
  Administered 2016-05-20: 200 mg via ORAL
  Filled 2016-05-19: qty 1

## 2016-05-19 MED ORDER — HYDROCODONE-ACETAMINOPHEN 7.5-325 MG/15ML PO SOLN
15.0000 mL | ORAL | Status: DC | PRN
  Administered 2016-05-19 – 2016-05-20 (×4): 15 mL via ORAL
  Filled 2016-05-19 (×6): qty 15

## 2016-05-19 MED ORDER — PANTOPRAZOLE SODIUM 40 MG PO TBEC
40.0000 mg | DELAYED_RELEASE_TABLET | Freq: Every day | ORAL | Status: DC
  Administered 2016-05-20: 40 mg via ORAL
  Filled 2016-05-19 (×3): qty 1

## 2016-05-19 MED ORDER — DEXAMETHASONE SODIUM PHOSPHATE 10 MG/ML IJ SOLN
20.0000 mg | Freq: Once | INTRAMUSCULAR | Status: AC
  Administered 2016-05-19: 20 mg via INTRAVENOUS

## 2016-05-19 MED ORDER — AMITRIPTYLINE HCL 10 MG PO TABS
10.0000 mg | ORAL_TABLET | Freq: Every day | ORAL | Status: DC
  Administered 2016-05-19: 10 mg via ORAL
  Filled 2016-05-19: qty 1

## 2016-05-19 MED ORDER — CLINDAMYCIN PALMITATE HCL 75 MG/5ML PO SOLR: 300.0000 mg | Freq: Three times a day (TID) | ORAL | Status: DC

## 2016-05-19 MED ORDER — ONDANSETRON HCL 4 MG/2ML IJ SOLN
4.0000 mg | INTRAMUSCULAR | Status: DC | PRN
  Administered 2016-05-19: 4 mg via INTRAVENOUS
  Filled 2016-05-19 (×2): qty 2

## 2016-05-19 MED ORDER — KCL IN DEXTROSE-NACL 20-5-0.45 MEQ/L-%-% IV SOLN
INTRAVENOUS | Status: DC
  Administered 2016-05-19 – 2016-05-20 (×2): via INTRAVENOUS
  Filled 2016-05-19 (×4): qty 1000

## 2016-05-19 MED ORDER — CLINDAMYCIN PALMITATE HCL 75 MG/5ML PO SOLR
300.0000 mg | Freq: Three times a day (TID) | ORAL | Status: DC
  Administered 2016-05-19 – 2016-05-20 (×4): 300 mg via ORAL
  Filled 2016-05-19 (×5): qty 20

## 2016-05-19 MED ORDER — PREDNISONE 5 MG/ML PO CONC
50.0000 mg | Freq: Every day | ORAL | Status: DC
  Administered 2016-05-20: 50 mg via ORAL
  Filled 2016-05-19 (×2): qty 10

## 2016-05-19 MED ORDER — ONDANSETRON HCL 4 MG PO TABS: 4.0000 mg | ORAL_TABLET | ORAL | Status: DC | PRN

## 2016-05-19 MED ORDER — PREDNISONE 5 MG/5ML PO SOLN: 50.0000 mg | Freq: Every day | ORAL | Status: AC

## 2016-05-19 MED ORDER — HYDROCODONE-ACETAMINOPHEN 7.5-325 MG/15ML PO SOLN: 15.0000 mL | Freq: Four times a day (QID) | ORAL | Status: AC | PRN

## 2016-05-19 MED ORDER — DEXAMETHASONE SODIUM PHOSPHATE 10 MG/ML IJ SOLN
20.0000 mg | Freq: Once | INTRAMUSCULAR | Status: DC
  Filled 2016-05-19: qty 2

## 2016-05-19 MED ORDER — BUPROPION HCL ER (SR) 100 MG PO TB12
200.0000 mg | ORAL_TABLET | Freq: Two times a day (BID) | ORAL | Status: DC
  Administered 2016-05-19 – 2016-05-20 (×2): 200 mg via ORAL
  Filled 2016-05-19 (×3): qty 2

## 2016-05-19 MED ORDER — FENOFIBRATE 160 MG PO TABS
160.0000 mg | ORAL_TABLET | Freq: Every day | ORAL | Status: DC
  Administered 2016-05-19: 160 mg via ORAL
  Filled 2016-05-19 (×2): qty 1

## 2016-05-19 MED ORDER — ATORVASTATIN CALCIUM 10 MG PO TABS
10.0000 mg | ORAL_TABLET | Freq: Every day | ORAL | Status: DC
  Administered 2016-05-19: 10 mg via ORAL
  Filled 2016-05-19: qty 1

## 2016-05-19 NOTE — Consult Note (Signed)
Reason for Consult:Floor of mouth swelling Referring Physician: ER  Jim OlszewskiRobert Smith is an 50 y.o. male.  HPI: 50 year old male underwent right floor of mouth rannula excision by Dr. Pollyann Kennedyosen two days ago.  He seemed to be doing pretty well yesterday but had some worsening pain through the day.  This morning, however, he woke up in much worse pain with swelling in the mouth and difficulty swallowing even liquids, much less his medications.  He called me with this problem and I told him to come to the ER.  Past Medical History  Diagnosis Date  . Acute ear infection   . Bronchitis   . H/O removal of neck cyst 01/2012    R anterior neck I&D, with abx  . Sleep apnea     uses Bipap occaisionally  . GERD (gastroesophageal reflux disease)   . Anxiety   . Hyperlipidemia     Past Surgical History  Procedure Laterality Date  . Pilonidal cyst excision    . Fracture surgery Left 2009    tibial fx    No family history on file.  Social History:  reports that he has never smoked. He does not have any smokeless tobacco history on file. He reports that he does not drink alcohol or use illicit drugs.  Allergies:  Allergies  Allergen Reactions  . Morphine And Related Nausea And Vomiting  . Penicillins Rash    Medications: I have reviewed the patient's current medications.  No results found for this or any previous visit (from the past 48 hour(s)).  No results found.  Review of Systems  HENT: Positive for sore throat.   Musculoskeletal: Positive for neck pain.  All other systems reviewed and are negative.  There were no vitals taken for this visit. Physical Exam  Constitutional: He is oriented to person, place, and time. He appears well-developed and well-nourished. No distress.  Clearly uncomfortable.  HENT:  Head: Normocephalic and atraumatic.  Right Ear: External ear normal.  Left Ear: External ear normal.  Nose: Nose normal.  Floor of mouth edema across both sides, inflammatory  changes involving right side.  Somewhat muffled voice.  Eyes: Conjunctivae and EOM are normal. Pupils are equal, round, and reactive to light.  Neck: Normal range of motion. Neck supple.  Mild right submandibular gland tenderness without swelling.  Neck without significant edema.  Cardiovascular: Normal rate.   Respiratory: Effort normal.  Musculoskeletal: Normal range of motion.  Neurological: He is alert and oriented to person, place, and time. No cranial nerve deficit.  Skin: Skin is warm and dry.  Psychiatric: He has a normal mood and affect. His behavior is normal. Judgment and thought content normal.    Assessment/Plan: Floor of mouth edema s/p right rannula excision His problem appears to be swelling and inflammation related to his surgery causing dysphagia.  I do not think he has evidence of infection on exam.  His airway is not compromised at present but he is not able to swallow liquids currently.  In discussing options, we agreed to treat with a dose of IV steroids and observe for the next couple of hours.  If symptoms improve and he feels he can swallow liquids and medications, he will be discharged from the ER.  If not, he will be admitted for observation.  Jim Smith 05/19/2016, 8:04 AM

## 2016-05-19 NOTE — ED Provider Notes (Signed)
Patient feels only marginally better. Is able to barely swallow water but still feels like fluid is stuck in oropharynx. Dr. Jenne PaneBates to admit  Jim LovelessScott Ashanna Heinsohn, MD 05/19/16 [phone]

## 2016-05-19 NOTE — ED Notes (Signed)
PT reports he uses a C-PAP nightly but unable to use since surgery on 05-17-16 . Pt O2 sats 87% on room air . Pt started on 2 liters of nasal O2. O2 sats increased to 100 %

## 2016-05-19 NOTE — Progress Notes (Signed)
Patient ID: Jim OlszewskiRobert Smith, male   DOB: 1966/09/15, 50 y.o.   MRN: [id number] Patient reports that he feels improvement in the throat pain and swelling, able to swallow liquids better. A/P: Edema following right ranula removal Will continue overnight observation with IV fluids and medications.  Anticipate he will be able to go home tomorrow.

## 2016-05-19 NOTE — ED Notes (Signed)
PT had oral surgery on 05-17-16 and presents today with complications and pain control problems.

## 2016-05-19 NOTE — ED Provider Notes (Signed)
[name]: [id number]     Arrival date & time 05/19/16  0744 History   First MD Initiated Contact with Jim Smith 05/19/16 [phone]     Chief Complaint  Jim Smith presents with  . Post-op Problem     (Consider location/radiation/quality/duration/timing/severity/associated sxs/prior Treatment) The history is provided by the Jim Smith and medical records.    Jim y.o. M with hx of bronchitis, sleep apnea, GERD, anxiety, HLP, presenting to the ED for oral swelling.  He has surgery performed 2 days ago by Dr. Pollyann Kennedy and had portions of his salivary glands removed.  States overall he had been doing well until this morning when she had increased swelling on the floor of his mouth and difficulty swallowing.  Also reports increased pain.  He denies any fever, chills.  He is able to swallow his saliva.    Past Medical History  Diagnosis Date  . Acute ear infection   . Bronchitis   . H/O removal of neck cyst 01/2012    R anterior neck I&D, with abx  . Sleep apnea     uses Bipap occaisionally  . GERD (gastroesophageal reflux disease)   . Anxiety   . Hyperlipidemia    Past Surgical History  Procedure Laterality Date  . Pilonidal cyst excision    . Fracture surgery Left 2009    tibial fx   No family history on file. Social History  Substance Use Topics  . Smoking status: Never Smoker   . Smokeless tobacco: Not on file  . Alcohol Use: No    Review of Systems  HENT: Positive for facial swelling.   All other systems reviewed and are negative.     Allergies  Morphine and related and Penicillins  Home Medications   Prior to Admission medications   Medication Sig Start Date End Date Taking? Authorizing Provider  amitriptyline (ELAVIL) 50 MG tablet Take 10 mg by mouth at bedtime.     Historical Provider, MD  atorvastatin (LIPITOR) 20 MG tablet Take 20 mg by mouth daily.    Historical Provider, MD  buPROPion (WELLBUTRIN SR) 100 MG 12 hr tablet Take 100 mg by mouth 2 (two) times daily.    Historical  Provider, MD  clindamycin (CLEOCIN) 300 MG capsule Take 1 capsule (300 mg total) by mouth 3 (three) times daily. 05/17/16   Serena Colonel, MD  Dexlansoprazole (DEXILANT) 30 MG capsule Take 30 mg by mouth daily.    Historical Provider, MD  fenofibrate 54 MG tablet Take 54 mg by mouth daily.    Historical Provider, MD  HYDROcodone-acetaminophen (NORCO) 7.5-325 MG tablet Take 1 tablet by mouth every 6 (six) hours as needed for moderate pain. 05/17/16   Serena Colonel, MD  promethazine (PHENERGAN) 25 MG suppository Place 1 suppository (25 mg total) rectally every 6 (six) hours as needed for nausea or vomiting. 05/17/16   Serena Colonel, MD   There were no vitals taken for this visit.   Physical Exam  Constitutional: He is oriented to person, place, and time. He appears well-developed and well-nourished.  HENT:  Head: Normocephalic and atraumatic.  Mouth/Throat: Oropharynx is clear and moist.  Edema noted to floor of mouth; no swelling of the face, lips, or tongue; handling secretions appropriately  Eyes: Conjunctivae and EOM are normal. Pupils are equal, round, and reactive to light.  Neck: Normal range of motion.  Cardiovascular: Normal rate, regular rhythm and normal heart sounds.   Pulmonary/Chest: Effort normal and breath sounds normal.  Abdominal: Soft. Bowel sounds are normal.  Musculoskeletal: Normal range of motion.  Neurological: He is alert and oriented to person, place, and time.  Skin: Skin is warm and dry.  Psychiatric: He has a normal mood and affect.  Nursing note and vitals reviewed.   ED Course  Procedures (including critical care time) Labs Review Labs Reviewed - No data to display  Imaging Review No results found. I have personally reviewed and evaluated these images and lab results as part of my medical decision-making.   EKG Interpretation None      MDM   Final diagnoses:  Acute post-operative pain   Jim Smith here for oral swelling after salivary gland  removal 2 days ago with Dr. Pollyann Kennedyosen. On-call ENT, Dr. Jenne PaneBates, has evaluated Jim Smith in the ED and placed orders for for IV fluids, Decadron, PO hycet for pain.  Plan to observe in the ED for a few hours.  He will call back to check on Jim Smith and discuss states as to whether or not he will require admission. On exam, Jim Smith does have some edema to the floor of his mouth, no airway compromise currently.  He is handling his secretions appropriately.    Jim Smith transferred to acute side for continued monitoring.  Dr. Criss AlvineGoldston to assume care.  Garlon HatchetLisa M Deni Berti, PA-C 05/19/16 1008  Gerhard Munchobert Lockwood, MD 05/19/16 [id number]

## 2016-05-20 ENCOUNTER — Encounter (HOSPITAL_BASED_OUTPATIENT_CLINIC_OR_DEPARTMENT_OTHER): Payer: Self-pay | Admitting: Otolaryngology

## 2016-05-20 MED ORDER — PREDNISONE 10 MG (21) PO TBPK: 10.0000 mg | ORAL_TABLET | Freq: Every day | ORAL | Status: DC

## 2016-05-20 NOTE — Discharge Instructions (Signed)
Start prednisone Tuesday

## 2016-05-20 NOTE — Progress Notes (Signed)
Patient ID: Jim OlszewskiRobert Smith, male   DOB: 1966-09-07, 50 y.o.   MRN: [id number] Subjective: Feeling much better, able to swallow and drink, pain has diminished.   Objective: Vital signs in last 24 hours: Temp:  [97.4 F (36.3 C)-97.9 F (36.6 C)] 97.9 F (36.6 C) (05/22 0516) Pulse Rate:  [61-79] 63 (05/22 0516) Resp:  [18-19] 18 (05/22 0516) BP: (118-142)/(63-112) 123/68 mmHg (05/22 0516) SpO2:  [95 %-100 %] 96 % (05/22 0516) Weight:  [99.791 kg (220 lb)] 99.791 kg (220 lb) (05/21 1334) Weight change:  Last BM Date: 05/20/16  Intake/Output from previous day: 05/21 0701 - 05/22 0700 In: 240 [P.O.:240] Out: -  Intake/Output this shift: Total I/O In: 716 [P.O.:716] Out: -   PHYSICAL EXAM: No stridor, voice is clear. No neck swelling. Floor of mouth looks excellent. No edema of the oral cavity or pharynx.  Lab Results: No results for input(s): WBC, HGB, HCT, PLT in the last 72 hours. BMET No results for input(s): NA, K, CL, CO2, GLUCOSE, BUN, CREATININE, CALCIUM in the last 72 hours.  Studies/Results: No results found.  Medications: I have reviewed the patient's current medications.  Assessment/Plan: Improving quickly. May have had angioedema related to the surgical trauma. No signs of infection. Try regular diet and discharge home if able today.     [name] 05/20/2016, 9:57 AM

## 2016-05-20 NOTE — Discharge Summary (Signed)
Physician Discharge Summary  Patient ID: Jim Smith MRN: [id number] DOB/AGE: 1966-08-13 50 y.o.  Admit date: 05/19/2016 Discharge date: 05/20/2016  Admission Diagnoses:post op oral cavity edema  Discharge Diagnoses:  Active Problems:   Ranula of floor of mouth   Discharged Condition: good  Hospital Course: improved on steroids and IV fluids  Consults: none  Significant Diagnostic Studies: none  Treatments: IV hydration  Discharge Exam: Blood pressure 130/72, pulse 73, temperature 98.3 F (36.8 C), temperature source Oral, resp. rate 18, height  (1.88 m), weight 99.791 kg (220 lb), SpO2 100 %. PHYSICAL EXAM: Floor of mouth without edema, no neck masses.   Disposition: 01-Home or Self Care  Discharge Instructions    Diet - low sodium heart healthy    Complete by:  As directed      Increase activity slowly    Complete by:  As directed             Medication List    TAKE these medications        amitriptyline 10 MG tablet  Commonly known as:  ELAVIL  Take 10 mg by mouth daily.     atorvastatin 10 MG tablet  Commonly known as:  LIPITOR  Take 10 mg by mouth daily.     buPROPion 200 MG 12 hr tablet  Commonly known as:  WELLBUTRIN SR  Take 200 mg by mouth 2 (two) times daily.     clindamycin 300 MG capsule  Commonly known as:  CLEOCIN  Take 1 capsule (300 mg total) by mouth 3 (three) times daily.     clindamycin 75 MG/5ML solution  Commonly known as:  CLEOCIN  Take 20 mLs (300 mg total) by mouth 3 (three) times daily.     DEXILANT 30 MG capsule  Generic drug:  Dexlansoprazole  Take 30 mg by mouth daily.     fenofibrate 160 MG tablet  Take 160 mg by mouth daily.     HYDROcodone-acetaminophen 7.5-325 MG tablet  Commonly known as:  NORCO  Take 1 tablet by mouth every 6 (six) hours as needed for moderate pain.     HYDROcodone-acetaminophen 7.5-325 mg/15 ml solution  Commonly known as:  HYCET  Take 15 mLs by mouth 4 (four) times daily as needed  for moderate pain.     ibuprofen 200 MG tablet  Commonly known as:  ADVIL,MOTRIN  Take 200 mg by mouth every 4 (four) hours as needed (pain).     predniSONE 5 MG/5ML solution  Take 50 mLs (50 mg total) by mouth daily with breakfast.     predniSONE 10 MG (21) Tbpk tablet  Commonly known as:  STERAPRED UNI-PAK 21 TAB  Take 1 tablet (10 mg total) by mouth daily. Take five tablets Day 1 Take 4 tablets Day 2 Take 3 tablets Day 3 Take 2 tablets Day 4 Take 1 tablet Day 5     promethazine 25 MG suppository  Commonly known as:  PHENERGAN  Place 1 suppository (25 mg total) rectally every 6 (six) hours as needed for nausea or vomiting.           Follow-up Information    Schedule an appointment as soon as possible for a visit with Serena Colonel, MD.   Specialty:  Otolaryngology   Why:  keep as scheduled   Contact information:   [address] [place] [address] [phone]       Follow up with Serena Colonel, MD. Schedule an appointment as soon  as possible for a visit on 05/24/2016.   Specialty:  Otolaryngology   Contact information:   [address] [place] [address] [phone]       Signed: Serena ColonelROSEN, Cynithia Hakimi 05/20/2016, 2:15 PM

## 2017-07-18 ENCOUNTER — Emergency Department (HOSPITAL_COMMUNITY): Payer: Commercial Managed Care - PPO

## 2017-07-18 ENCOUNTER — Encounter (HOSPITAL_COMMUNITY): Payer: Self-pay | Admitting: *Deleted

## 2017-07-18 ENCOUNTER — Emergency Department (HOSPITAL_COMMUNITY)
Admission: EM | Admit: 2017-07-18 | Discharge: 2017-07-18 | Disposition: A | Discharge status: Home / Self Care | Payer: Commercial Managed Care - PPO | Attending: Emergency Medicine | Admitting: Emergency Medicine

## 2017-07-18 ENCOUNTER — Other Ambulatory Visit: Payer: Self-pay

## 2017-07-18 DIAGNOSIS — G43109 Migraine with aura, not intractable, without status migrainosus: Secondary | ICD-10-CM

## 2017-07-18 DIAGNOSIS — Z885 Allergy status to narcotic agent status: Secondary | ICD-10-CM | POA: Diagnosis not present

## 2017-07-18 DIAGNOSIS — R4182 Altered mental status, unspecified: Secondary | ICD-10-CM | POA: Diagnosis not present

## 2017-07-18 DIAGNOSIS — Z880 Allergy status to penicillin: Secondary | ICD-10-CM | POA: Diagnosis not present

## 2017-07-18 DIAGNOSIS — R42 Dizziness and giddiness: Secondary | ICD-10-CM | POA: Diagnosis not present

## 2017-07-18 DIAGNOSIS — Z79899 Other long term (current) drug therapy: Secondary | ICD-10-CM | POA: Diagnosis not present

## 2017-07-18 DIAGNOSIS — E162 Hypoglycemia, unspecified: Secondary | ICD-10-CM | POA: Diagnosis present

## 2017-07-18 LAB — PROTIME-INR
INR: 1.06
PROTHROMBIN TIME: 13.9 s (ref 11.4–15.2)

## 2017-07-18 LAB — COMPREHENSIVE METABOLIC PANEL
ALBUMIN: 4.4 g/dL (ref 3.5–5.0)
ALK PHOS: 42 U/L (ref 38–126)
ALT: 36 U/L (ref 17–63)
AST: 35 U/L (ref 15–41)
Anion gap: 9 (ref 5–15)
BUN: 16 mg/dL (ref 6–20)
CALCIUM: 9 mg/dL (ref 8.9–10.3)
CO2: 23 mmol/L (ref 22–32)
CREATININE: 1.33 mg/dL — AB (ref 0.61–1.24)
Chloride: 102 mmol/L (ref 101–111)
GFR calc Af Amer: >60 mL/min (ref >60)
GFR calc non Af Amer: >60 mL/min (ref >60)
GLUCOSE: 122 mg/dL — AB (ref 65–99)
Potassium: 3.5 mmol/L (ref 3.5–5.1)
SODIUM: 134 mmol/L — AB (ref 135–145)
Total Bilirubin: 0.7 mg/dL (ref 0.3–1.2)
Total Protein: 7 g/dL (ref 6.5–8.1)

## 2017-07-18 LAB — DIFFERENTIAL
Basophils Absolute: 0.1 10*3/uL (ref 0.0–0.1)
Basophils Relative: 1 %
Eosinophils Absolute: 0.1 10*3/uL (ref 0.0–0.7)
Eosinophils Relative: 1 %
LYMPHS ABS: 1.7 10*3/uL (ref 0.7–4.0)
LYMPHS PCT: 15 %
Monocytes Absolute: 0.8 10*3/uL (ref 0.1–1.0)
Monocytes Relative: 7 %
NEUTROS ABS: 8.7 10*3/uL — AB (ref 1.7–7.7)
NEUTROS PCT: 76 %

## 2017-07-18 LAB — CBG MONITORING, ED
Glucose-Capillary: 119 mg/dL — ABNORMAL HIGH (ref 65–99)
Glucose-Capillary: 169 mg/dL — ABNORMAL HIGH (ref 65–99)

## 2017-07-18 LAB — CBC
HCT: 42.5 % (ref 39.0–52.0)
HEMOGLOBIN: 14.7 g/dL (ref 13.0–17.0)
MCH: 30.6 pg (ref 26.0–34.0)
MCHC: 34.6 g/dL (ref 30.0–36.0)
MCV: 88.5 fL (ref 78.0–100.0)
PLATELETS: 280 10*3/uL (ref 150–400)
RBC: 4.8 MIL/uL (ref 4.22–5.81)
RDW: 13.5 % (ref 11.5–15.5)
WBC: 11.4 10*3/uL — AB (ref 4.0–10.5)

## 2017-07-18 LAB — I-STAT CHEM 8, ED
BUN: 19 mg/dL (ref 6–20)
CHLORIDE: 99 mmol/L — AB (ref 101–111)
CREATININE: 1.3 mg/dL — AB (ref 0.61–1.24)
Calcium, Ion: 1.12 mmol/L — ABNORMAL LOW (ref 1.15–1.40)
GLUCOSE: 120 mg/dL — AB (ref 65–99)
HCT: 45 % (ref 39.0–52.0)
Hemoglobin: 15.3 g/dL (ref 13.0–17.0)
POTASSIUM: 3.6 mmol/L (ref 3.5–5.1)
Sodium: 138 mmol/L (ref 135–145)
TCO2: 25 mmol/L (ref 0–100)

## 2017-07-18 LAB — I-STAT TROPONIN, ED: Troponin i, poc: 0 ng/mL (ref 0.00–0.08)

## 2017-07-18 LAB — APTT: aPTT: 28 seconds (ref 24–36)

## 2017-07-18 MED ORDER — SODIUM CHLORIDE 0.9 % IV BOLUS (SEPSIS)
1000.0000 mL | Freq: Once | INTRAVENOUS | Status: AC
  Administered 2017-07-18: 1000 mL via INTRAVENOUS

## 2017-07-18 MED ORDER — DIPHENHYDRAMINE HCL 50 MG/ML IJ SOLN
25.0000 mg | Freq: Once | INTRAMUSCULAR | Status: AC
  Administered 2017-07-18: 25 mg via INTRAVENOUS
  Filled 2017-07-18: qty 1

## 2017-07-18 MED ORDER — IOPAMIDOL (ISOVUE-370) INJECTION 76%
INTRAVENOUS | Status: AC
  Administered 2017-07-18: 50 mL
  Filled 2017-07-18: qty 50

## 2017-07-18 MED ORDER — METOCLOPRAMIDE HCL 5 MG/ML IJ SOLN
10.0000 mg | Freq: Once | INTRAMUSCULAR | Status: AC
  Administered 2017-07-18: 10 mg via INTRAVENOUS
  Filled 2017-07-18: qty 2

## 2017-07-18 MED ORDER — DIPHENHYDRAMINE HCL 50 MG/ML IJ SOLN
12.5000 mg | Freq: Once | INTRAMUSCULAR | Status: AC
  Administered 2017-07-18: 12.5 mg via INTRAVENOUS
  Filled 2017-07-18: qty 1

## 2017-07-18 MED ORDER — KETOROLAC TROMETHAMINE 30 MG/ML IJ SOLN
30.0000 mg | Freq: Once | INTRAMUSCULAR | Status: AC
  Administered 2017-07-18: 30 mg via INTRAVENOUS
  Filled 2017-07-18: qty 1

## 2017-07-18 NOTE — ED Triage Notes (Signed)
Pt came in through triage for stroke-like symptoms. Pt brought straight from lobby to CT and to room. Per triage RN pt was at work and around 1100 pt became weak, dizzy, unable to talk, confused and diaphoretic. Pt symptoms resolved by time pt roomed in ED. No neuro deficits other than slurred speech. Pt endorses hx of migraines and states his head began to hurt in route to ED.

## 2017-07-18 NOTE — ED Notes (Signed)
Neurologist at bedside. 

## 2017-07-18 NOTE — ED Provider Notes (Addendum)
[facility]-EMERGENCY DEPT Provider Note   [name]: [id number] Arrival date & time: 07/18/17  1232   An emergency department physician performed an initial assessment on this suspected stroke patient at 1241.  History   Chief Complaint Chief Complaint  Patient presents with  . Hypoglycemia  . Dizziness  . Altered Mental Status    HPI Jim Smith is a 51 y.o. male history of reflux, hyperlipidemia, complex migraine, here presenting with headaches. Patient had acute onset of dizziness and trouble speaking around 11 AM. Patient called his wife who brought him to the doctor's office and patient was sent here for evaluation. He was noted to have CBG 64. Patient also had headaches at that time but denies any vomiting. Code stroke was activated. Patient states that his symptoms have improved. Denies hx of stroke previously.   The history is provided by the patient.    Past Medical History:  Diagnosis Date  . Acute ear infection   . Anxiety   . Bronchitis   . GERD (gastroesophageal reflux disease)   . H/O removal of neck cyst 01/2012   R anterior neck I&D, with abx  . Hyperlipidemia   . Sleep apnea    uses Bipap occaisionally    Patient Active Problem List   Diagnosis Date Noted  . 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 FRACTURE SURGERY Left 2009   tibial fx  . PILONIDAL CYST EXCISION    . SUBMANDIBULAR GLAND EXCISION Right 05/17/2016   Procedure: INTRA ORAL EXCISION RIGHT SUBLINGUAL GLAND;  Surgeon: Serena Colonel, MD;  Location: [facility];  Service: ENT;  Laterality: Right;       Home Medications    Prior to Admission medications   Medication Sig Start Date End Date Taking? Authorizing Provider  amitriptyline (ELAVIL) 10 MG tablet Take 10 mg by mouth daily. 12/27/15   [provider]  atorvastatin (LIPITOR) 10 MG tablet Take 10 mg by mouth daily. 03/11/16   [provider]  buPROPion  (WELLBUTRIN SR) 200 MG 12 hr tablet Take 200 mg by mouth 2 (two) times daily. 12/15/15   [provider]  clindamycin (CLEOCIN) 300 MG capsule Take 1 capsule (300 mg total) by mouth 3 (three) times daily. 05/17/16   Serena Colonel, MD  clindamycin (CLEOCIN) 75 MG/5ML solution Take 20 mLs (300 mg total) by mouth 3 (three) times daily. 05/19/16   Christia Reading, MD  Dexlansoprazole (DEXILANT) 30 MG capsule Take 30 mg by mouth daily.    [provider]  fenofibrate 160 MG tablet Take 160 mg by mouth daily. 03/11/16   [provider]  HYDROcodone-acetaminophen (NORCO) 7.5-325 MG tablet Take 1 tablet by mouth every 6 (six) hours as needed for moderate pain. 05/17/16   Serena Colonel, MD  ibuprofen (ADVIL,MOTRIN) 200 MG tablet Take 200 mg by mouth every 4 (four) hours as needed (pain).    [provider]  predniSONE (STERAPRED UNI-PAK 21 TAB) 10 MG (21) TBPK tablet Take 1 tablet (10 mg total) by mouth daily. Take five tablets Day 1 Take 4 tablets Day 2 Take 3 tablets Day 3 Take 2 tablets Day 4 Take 1 tablet Day 5 05/20/16   Serena Colonel, MD  promethazine (PHENERGAN) 25 MG suppository Place 1 suppository (25 mg total) rectally every 6 (six) hours as needed for nausea or vomiting. Patient not taking: Reported on 05/19/2016 05/17/16   Serena Colonel, MD    Family History  No family history on file.  Social History Social History  Substance Use Topics  . Smoking status: Never Smoker  . Smokeless tobacco: Not on file  . Alcohol use No     Allergies   Morphine and related and Penicillins   Review of Systems Review of Systems  Neurological: Positive for dizziness and headaches.  All other systems reviewed and are negative.    Physical Exam Updated Vital Signs BP 132/83   Pulse 64   Resp 16   Wt 102.5 kg (226 lb)   SpO2 98%   BMI 29.02 kg/m   Physical Exam  Constitutional: He is oriented to person, place, and time. He appears well-developed.  HENT:  Head:  Normocephalic.  Mouth/Throat: Oropharynx is clear and moist.  Eyes: Pupils are equal, round, and reactive to light. Conjunctivae and EOM are normal.  Neck: Normal range of motion. Neck supple.  Cardiovascular: Normal rate, regular rhythm and normal heart sounds.   Pulmonary/Chest: Effort normal and breath sounds normal. No respiratory distress. He has no wheezes.  Abdominal: Soft. Bowel sounds are normal. He exhibits no distension. There is no tenderness.  Musculoskeletal: Normal range of motion. He exhibits no edema.  Neurological: He is alert and oriented to person, place, and time.  Slight slurred speech. CN otherwise unremarkable. Nl strength throughout. No pronator drift   Skin: Skin is warm.  Psychiatric: He has a normal mood and affect.  Nursing note and vitals reviewed.    ED Treatments / Results  Labs (all labs ordered are listed, but only abnormal results are displayed) Labs Reviewed  CBC - Abnormal; Notable for the following:       Result Value   WBC 11.4 (*)    All other components within normal limits  DIFFERENTIAL - Abnormal; Notable for the following:    Neutro Abs 8.7 (*)    All other components within normal limits  COMPREHENSIVE METABOLIC PANEL - Abnormal; Notable for the following:    Sodium 134 (*)    Glucose, Bld 122 (*)    Creatinine, Ser 1.33 (*)    All other components within normal limits  CBG MONITORING, ED - Abnormal; Notable for the following:    Glucose-Capillary 169 (*)    All other components within normal limits  CBG MONITORING, ED - Abnormal; Notable for the following:    Glucose-Capillary 119 (*)    All other components within normal limits  I-STAT CHEM 8, ED - Abnormal; Notable for the following:    Chloride 99 (*)    Creatinine, Ser 1.30 (*)    Glucose, Bld 120 (*)    Calcium, Ion 1.12 (*)    All other components within normal limits  PROTIME-INR  APTT  I-STAT TROPONIN, ED    EKG  EKG Interpretation None       Radiology Ct  Angio Head W Or Wo Contrast  Result Date: 07/18/2017 CLINICAL DATA:  Acute presentation with sweating, dizziness and confusion. EXAM: CT ANGIOGRAPHY HEAD AND NECK TECHNIQUE: Multidetector CT imaging of the head and neck was performed using the standard protocol during bolus administration of intravenous contrast. Multiplanar CT image reconstructions and MIPs were obtained to evaluate the vascular anatomy. Carotid stenosis measurements (when applicable) are obtained utilizing NASCET criteria, using the distal internal carotid diameter as the denominator. CONTRAST:  50 cc Isovue 370 COMPARISON:  CT same day FINDINGS: CTA NECK FINDINGS Aortic arch: Normal. No atherosclerotic change, aneurysm or dissection. Branching pattern is normal. Right carotid system: Common carotid  artery widely patent to the bifurcation. The bifurcation is normal without evidence of atherosclerotic plaque, narrowing or irregularity. Cervical ICA is normal. Left carotid system: Common carotid artery widely patent to the bifurcation. Carotid bifurcation is normal without evidence of atherosclerotic change, narrowing or irregularity. Cervical ICA is normal. Vertebral arteries: Both vertebral artery origins are widely patent. Both vertebral arteries are patent and normal through the neck, approximately equal in size. Skeleton: Normal Other neck: No mass or lymphadenopathy. Upper chest: Lungs are clear.  No superior mediastinal pathology. Review of the MIP images confirms the above findings CTA HEAD FINDINGS Anterior circulation: Both internal carotid arteries are widely patent through the siphon regions. There is mild atherosclerotic calcification in the carotid siphon regions but no stenosis more than about 20%. Supraclinoid internal carotid arteries are widely patent. Anterior and middle cerebral arteries are widely patent. Posterior circulation: Both vertebral arteries are widely patent through the foramen magnum to the basilar. No basilar  stenosis. Posterior circulation branch vessels are normal. Venous sinuses: Patent and normal. Anatomic variants: None significant Delayed phase: No abnormal enhancement Review of the MIP images confirms the above findings IMPRESSION: No evidence of flow-limiting stenosis or acute vascular finding. Ordinary atherosclerotic calcification in the carotid siphon regions but without stenosis greater than 20%. These results were called by telephone at the time of interpretation on 07/18/2017 at 1:55 pm to Dr. Chaney Malling , who verbally acknowledged these results. Electronically Signed   By: Paulina Fusi M.D.   On: 07/18/2017 13:55   Ct Angio Neck W And/or Wo Contrast  Result Date: 07/18/2017 CLINICAL DATA:  Acute presentation with sweating, dizziness and confusion. EXAM: CT ANGIOGRAPHY HEAD AND NECK TECHNIQUE: Multidetector CT imaging of the head and neck was performed using the standard protocol during bolus administration of intravenous contrast. Multiplanar CT image reconstructions and MIPs were obtained to evaluate the vascular anatomy. Carotid stenosis measurements (when applicable) are obtained utilizing NASCET criteria, using the distal internal carotid diameter as the denominator. CONTRAST:  50 cc Isovue 370 COMPARISON:  CT same day FINDINGS: CTA NECK FINDINGS Aortic arch: Normal. No atherosclerotic change, aneurysm or dissection. Branching pattern is normal. Right carotid system: Common carotid artery widely patent to the bifurcation. The bifurcation is normal without evidence of atherosclerotic plaque, narrowing or irregularity. Cervical ICA is normal. Left carotid system: Common carotid artery widely patent to the bifurcation. Carotid bifurcation is normal without evidence of atherosclerotic change, narrowing or irregularity. Cervical ICA is normal. Vertebral arteries: Both vertebral artery origins are widely patent. Both vertebral arteries are patent and normal through the neck, approximately equal in size.  Skeleton: Normal Other neck: No mass or lymphadenopathy. Upper chest: Lungs are clear.  No superior mediastinal pathology. Review of the MIP images confirms the above findings CTA HEAD FINDINGS Anterior circulation: Both internal carotid arteries are widely patent through the siphon regions. There is mild atherosclerotic calcification in the carotid siphon regions but no stenosis more than about 20%. Supraclinoid internal carotid arteries are widely patent. Anterior and middle cerebral arteries are widely patent. Posterior circulation: Both vertebral arteries are widely patent through the foramen magnum to the basilar. No basilar stenosis. Posterior circulation branch vessels are normal. Venous sinuses: Patent and normal. Anatomic variants: None significant Delayed phase: No abnormal enhancement Review of the MIP images confirms the above findings IMPRESSION: No evidence of flow-limiting stenosis or acute vascular finding. Ordinary atherosclerotic calcification in the carotid siphon regions but without stenosis greater than 20%. These results were called by telephone at  the time of interpretation on 07/18/2017 at 1:55 pm to Dr. Chaney Malling , who verbally acknowledged these results. Electronically Signed   By: Paulina Fusi M.D.   On: 07/18/2017 13:55   Ct Head Code Stroke W/o Cm  Result Date: 07/18/2017 CLINICAL DATA:  Code stroke. Dizziness and confusion. Difficulty speaking. EXAM: CT HEAD WITHOUT CONTRAST TECHNIQUE: Contiguous axial images were obtained from the base of the skull through the vertex without intravenous contrast. COMPARISON:  03/05/2008 FINDINGS: Brain: There is no evidence of acute infarct, intracranial hemorrhage, mass, midline shift, or extra-axial fluid collection. The ventricles and sulci are normal. A tiny cavum septum pellucidum is noted. Vascular: No hyperdense vessel. Bilateral carotid siphon atherosclerosis. Skull: No fracture or focal osseous lesion. Sinuses/Orbits: Minimal scattered  paranasal sinus mucosal thickening. Clear mastoid air cells. Unremarkable orbits. Other: None. ASPECTS [facility] Stroke Program Early CT Score) - Ganglionic level infarction (caudate, lentiform nuclei, internal capsule, insula, M1-M3 cortex): 7 - Supraganglionic infarction (M4-M6 cortex): 3 Total score (0-10 with 10 being normal): 10 IMPRESSION: 1. Unremarkable CT appearance of the brain. 2. ASPECTS is 10. These results were called by telephone at the time of interpretation on 07/18/2017 at 1:02 pm to Dr. Chaney Malling , who verbally acknowledged these results. Electronically Signed   By: Sebastian Ache M.D.   On: 07/18/2017 12:59    Procedures Procedures (including critical care time)  Medications Ordered in ED Medications  iopamidol (ISOVUE-370) 76 % injection (50 mLs  Contrast Given 07/18/17 1325)  sodium chloride 0.9 % bolus 1,000 mL (1,000 mLs Intravenous New Bag/Given 07/18/17 1425)  metoCLOPramide (REGLAN) injection 10 mg (10 mg Intravenous Given 07/18/17 1427)  diphenhydrAMINE (BENADRYL) injection 12.5 mg (12.5 mg Intravenous Given 07/18/17 1430)  ketorolac (TORADOL) 30 MG/ML injection 30 mg (30 mg Intravenous Given 07/18/17 1433)     Initial Impression / Assessment and Plan / ED Course  I have reviewed the triage vital signs and the nursing notes.  Pertinent labs & imaging results that were available during my care of the patient were reviewed by me and considered in my medical decision making (see chart for details).    Jim Smith is a 51 y.o. male here with headache, dizziness, trouble speaking. Patient states that this is similar to previous complex migraines. Doesn't see neurologist anymore. Code stroke activated. Dr. Amada Jupiter saw patient and thinks likely complex migraine. I ordered MRI brain to r/o TIA vs stroke but he doesn't think that patient had a stroke and would try migraine cocktail for now and reassess.   3:39 PM CTA unremarkable. Headache improved with migraine cocktail.  Hasn't seen any neurologist in town. Patient's speech back to baseline per wife. Will refer to [facility] outpatient.    Final Clinical Impressions(s) / ED Diagnoses   Final diagnoses:  None    New Prescriptions New Prescriptions   No medications on file     Charlynne Pander, MD 07/18/17 1539    Charlynne Pander, MD 07/18/17 1540

## 2017-07-18 NOTE — Discharge Instructions (Signed)
Take tylenol, motrin for headaches.   [facility]   Return to ER if you have worse headaches, slurred speech, weakness, numbness.

## 2017-07-18 NOTE — Consult Note (Signed)
Neurology Consultation Reason for Consult: Confusion Referring Physician: Silverio LayYao, D  CC: Dizziness  History is obtained from: Patient  HPI: Jim Smith is a 51 y.o. male with a history of migraines who was in his normal state of health until around 11 AM. At that point, he began getting dizzy and noticed that he was having some delayed in his thinking. He is having trouble thinking of words, and therefore his wife prominent at the doctor's office. There he was found to have a blood sugar of 64, and he was referred to the emergency department.  In the ER, a code stroke was activated. When I saw him, he was beginning to have a photophobic headache typical of his migraines. He states that the symptoms that he describes above have all occurred with migraines in the past.  LKW: 11 AM tpa given?: no, not a stroke    ROS: A 14 point ROS was performed and is negative except as noted in the HPI.  Past Medical History:  Diagnosis Date  . Acute ear infection   . Anxiety   . Bronchitis   . GERD (gastroesophageal reflux disease)   . H/O removal of neck cyst 01/2012   R anterior neck I&D, with abx  . Hyperlipidemia   . Sleep apnea    uses Bipap occaisionally     No family history on file.   Social History:  reports that he has never smoked. He does not have any smokeless tobacco history on file. He reports that he does not drink alcohol or use drugs.   Exam: Current vital signs: BP 131/64 (BP Location: Right Arm)   Pulse 74   Resp (!) 21   Wt 102.5 kg (226 lb)   SpO2 100%   BMI 29.02 kg/m  Vital signs in last 24 hours: Pulse Rate:  [63-74] 74 (07/20 1550) Resp:  [10-21] 21 (07/20 1550) BP: (131-143)/(64-84) 131/64 (07/20 1550) SpO2:  [94 %-100 %] 100 % (07/20 1550) Weight:  [102.5 kg (226 lb)] 102.5 kg (226 lb) (07/20 1313)   Physical Exam  Constitutional: Appears well-developed and well-nourished.  Psych: Affect appropriate to situation Eyes: No scleral injection HENT: No  OP obstrucion Head: Normocephalic.  Cardiovascular: Normal rate and regular rhythm.  Respiratory: Effort normal and breath sounds normal to anterior ascultation GI: Soft.  No distension. There is no tenderness.  Skin: WDI  Neuro: Mental Status: Patient is awake, alert, oriented to person, place, month, year, and situation. Patient is able to give a clear and coherent history. No signs of aphasia or neglect Cranial Nerves: II: Visual Fields are full. Pupils are equal, round, and reactive to light.   III,IV, VI: EOMI without ptosis or diploplia.  V: Facial sensation is symmetric to temperature VII: Facial movement is symmetric.  VIII: hearing is intact to voice X: Uvula elevates symmetrically XI: Shoulder shrug is symmetric. XII: tongue is midline without atrophy or fasciculations.  Motor: Tone is normal. Bulk is normal. 5/5 strength was present in all four extremities.  Sensory: Sensation is symmetric to light touch and temperature in the arms and legs. Deep Tendon Reflexes: 2+ and symmetric in the biceps and patellae.  Plantars: Toes are downgoing bilaterally.  Cerebellar: FNF and HKS are intact bilaterally      I have reviewed labs in epic and the results pertinent to this consultation are: CMP-unremarkable other than borderline creatinine  I have reviewed the images obtained: CT head-negative  Impression: 51 year old male with symptoms consistent with his previous  migraines. Given that he has had these repeatedly in the past, I would favor treating this as, acute migraine. He still has persistent neurological deficits following treatment, then could consider MRI to think that this is low yield at this time.  Recommendations: 1) treatment of comment located  migraine, no further workup unless he does not return to baseline.   Ritta Slot, MD [facility] [phone]  If 7pm- 7am, please page neurology on call as listed in AMION.

## 2017-08-04 ENCOUNTER — Ambulatory Visit (INDEPENDENT_AMBULATORY_CARE_PROVIDER_SITE_OTHER): Payer: Commercial Managed Care - PPO | Admitting: Neurology

## 2017-08-04 ENCOUNTER — Encounter: Payer: Self-pay | Admitting: Neurology

## 2017-08-04 VITALS — BP 135/91 | HR 69 | Ht 74.0 in | Wt 224.2 lb

## 2017-08-04 DIAGNOSIS — G43909 Migraine, unspecified, not intractable, without status migrainosus: Secondary | ICD-10-CM | POA: Insufficient documentation

## 2017-08-04 DIAGNOSIS — F419 Anxiety disorder, unspecified: Secondary | ICD-10-CM

## 2017-08-04 DIAGNOSIS — G4739 Other sleep apnea: Secondary | ICD-10-CM

## 2017-08-04 DIAGNOSIS — G43809 Other migraine, not intractable, without status migrainosus: Secondary | ICD-10-CM

## 2017-08-04 DIAGNOSIS — G4730 Sleep apnea, unspecified: Secondary | ICD-10-CM | POA: Insufficient documentation

## 2017-08-04 MED ORDER — NORTRIPTYLINE HCL 25 MG PO CAPS: 50.0000 mg | ORAL_CAPSULE | Freq: Every day | ORAL | 11 refills | Status: DC

## 2017-08-04 NOTE — Progress Notes (Signed)
PATIENT: Jim Smith DOB: January 12, 1966  Chief Complaint  Patient presents with  . NP ED Migraines    no migraines,   Has hx of , was taking amitriptyline at one time which helped, but LFTs elevated.      HISTORICAL  Jim Smith is of 51 years old right-handed male, seen in refer by emergency room to follow-up on his emergency room presentation on July 18 2017, initial evaluation was on August the sixth 2018.  He reported a history of hyperlipidemia, obstructive sleep apnea, but could not tolerate CPAP machine due to facial breakout with tight facial mask seal, also had a history of anxiety.  He elaborate on complicated and difficult childhood relationship.  He did have a history of migraine headaches about 20 years ago, has tried different abortive treatment in the past that was not effective, he has not had a migraine for many years, his typical migraine are severe pounding headache with associated light noise sensitivity, nauseous, often has associated left hand numbness.  He was put on Elavil 10 mg every night as preventive medication, he has taken for many years, stopped since 2015.  On July 18 2017, while at work, sitting in front of the computer, he suddenly felt blurry vision, sweaty, severe pounding headache, with light noise sensitivity, he was taken to the emergency room, symptoms last about 3 hours, has helped by Benadryl, Reglan, and Toradol,  Personally reviewed CT head without contrast that was normal, CT angiogram of the head and neck showed mild bilateral carotid artery siphons region narrowing about 20%.  Laboratory evaluation showed mild elevated creatinine 1.3, mild elevated WBC 11.4   REVIEW OF SYSTEMS: Full 14 system review of systems performed and notable only for anxiety, confusion, headache, numbness, weakness, slurred speech, dizziness, snoring, allergy, blurry vision, eye pain, snoring, ringing ears  ALLERGIES: Allergies  Allergen Reactions  . Morphine And  Related Nausea And Vomiting  . Penicillins Rash    HOME MEDICATIONS: Current Outpatient Prescriptions  Medication Sig Dispense Refill  . atorvastatin (LIPITOR) 10 MG tablet Take 10 mg by mouth daily.    Marland Kitchen. buPROPion (WELLBUTRIN SR) 200 MG 12 hr tablet Take 200 mg by mouth 2 (two) times daily.    . clindamycin (CLEOCIN) 75 MG/5ML solution Take 20 mLs (300 mg total) by mouth 3 (three) times daily. 200 mL 0  . Dexlansoprazole (DEXILANT) 30 MG capsule Take 30 mg by mouth daily.    . fenofibrate 160 MG tablet Take 160 mg by mouth daily.    Marland Kitchen. ibuprofen (ADVIL,MOTRIN) 200 MG tablet Take 200 mg by mouth every 4 (four) hours as needed (pain).     No current facility-administered medications for this visit.     PAST MEDICAL HISTORY: Past Medical History:  Diagnosis Date  . Acute ear infection   . Anxiety   . Bronchitis   . GERD (gastroesophageal reflux disease)   . H/O removal of neck cyst 01/2012   R anterior neck I&D, with abx  . Hyperlipidemia   . Migraine   . Sleep apnea    uses Bipap occaisionally    PAST SURGICAL HISTORY: Past Surgical History:  Procedure Laterality Date  . FRACTURE SURGERY Left 2009   tibial fx  . PILONIDAL CYST EXCISION    . SUBMANDIBULAR GLAND EXCISION Right 05/17/2016   Procedure: INTRA ORAL EXCISION RIGHT SUBLINGUAL GLAND;  Surgeon: Serena ColonelJefry Rosen, MD;  Location: [facility];  Service: ENT;  Laterality: Right;    FAMILY HISTORY:  Family History  Problem Relation Age of Onset  . Parkinson's disease Mother   . Depression Father   . Liver cancer Maternal Grandfather     SOCIAL HISTORY:  Social History   Social History  . Marital status: Married    Spouse name: N/A  . Number of children: N/A  . Years of education: N/A   Occupational History  . Not on file.   Social History Main Topics  . Smoking status: Never Smoker  . Smokeless tobacco: Never Used  . Alcohol use No  . Drug use: No  . Sexual activity: Not on file   Other  Topics Concern  . Not on file   Social History Narrative   Lives home with wife, [name].  and daughter.  [employer],  Education BS [place].  One child.  Caffeine 2-3 drinks daily.       PHYSICAL EXAM   Vitals:   08/04/17 1016  BP: (!) 135/91  Pulse: 69  Weight: 224 lb 3.2 oz (101.7 kg)  Height: 6\' 2"  (1.88 m)    Not recorded      Body mass index is 28.79 kg/m.  PHYSICAL EXAMNIATION:  Gen: NAD, conversant, well nourised, obese, well groomed                     Cardiovascular: Regular rate rhythm, no peripheral edema, warm, nontender. Eyes: Conjunctivae clear without exudates or hemorrhage Neck: Supple, no carotid bruits. Pulmonary: Clear to auscultation bilaterally   NEUROLOGICAL EXAM:  MENTAL STATUS: Speech:    Speech is normal; fluent and spontaneous with normal comprehension.  Cognition:     Orientation to time, place and person     Normal recent and remote memory     Normal Attention span and concentration     Normal Language, naming, repeating,spontaneous speech     Fund of knowledge   CRANIAL NERVES: CN II: Visual fields are full to confrontation. Fundoscopic exam is normal with sharp discs and no vascular changes. Pupils are round equal and briskly reactive to light. CN III, IV, VI: extraocular movement are normal. No ptosis. CN V: Facial sensation is intact to pinprick in all 3 divisions bilaterally. Corneal responses are intact.  CN VII: Face is symmetric with normal eye closure and smile. CN VIII: Hearing is normal to rubbing fingers CN IX, X: Palate elevates symmetrically. Phonation is normal. CN XI: Head turning and shoulder shrug are intact CN XII: Tongue is midline with normal movements and no atrophy.  MOTOR: There is no pronator drift of out-stretched arms. Muscle bulk and tone are normal. Muscle strength is normal.  REFLEXES: Reflexes are 2+ and symmetric at the biceps, triceps, knees, and ankles. Plantar responses are  flexor.  SENSORY: Intact to light touch, pinprick, positional sensation and vibratory sensation are intact in fingers and toes.  COORDINATION: Rapid alternating movements and fine finger movements are intact. There is no dysmetria on finger-to-nose and heel-knee-shin.    GAIT/STANCE: Posture is normal. Gait is steady with normal steps, base, arm swing, and turning. Heel and toe walking are normal. Tandem gait is normal.  Romberg is absent.   DIAGNOSTIC DATA (LABS, IMAGING, TESTING) - I reviewed patient records, labs, notes, testing and imaging myself where available.   ASSESSMENT AND PLAN  Jim Smith is a 51 y.o. male   Complex migraine Obstructive sleep apnea Anxiety disorder   Start low-dose nortriptyline 25 mg one tablet every night titrating to 2 tablets every night  I have  suggested to him continue follow-up with primary care physician  May consider Excedrin Migraine as needed    Levert Feinstein, M.D. Ph.D.  [facility] [address] [place], [place] [address] Ph: [phone] Fax: [phone]  CC: Referring Provider

## 2017-08-04 NOTE — Patient Instructions (Signed)
Make an Furniture conservator/restorerAppointment [facility] offers appointments at seven convenient locations, including [address]), [place], [address], [place], [place] and [place].  If you are a new or current patient, please call your Primary Care location office to make an appointment. Office hours vary, but most are open from 8:00 a.m. to 5:00 p.m., Monday-Friday.  If you wish to speak with a nurse or physician, please call 1-[facility] [phone]). Our policy is to return all calls received by 4:00 p.m. before the end of our workday. After hours, the answering service can contact the physician's extender or doctor on call. Non-urgent calls should not be made after hours.   [facility] [address], [address] [place], [place] [address]  Call: [phone] Fax: [phone]

## 2017-09-12 ENCOUNTER — Telehealth (HOSPITAL_COMMUNITY): Payer: Self-pay

## 2017-10-15 ENCOUNTER — Encounter (HOSPITAL_COMMUNITY): Payer: Self-pay | Admitting: Licensed Clinical Social Worker

## 2017-10-15 ENCOUNTER — Encounter (INDEPENDENT_AMBULATORY_CARE_PROVIDER_SITE_OTHER): Payer: Self-pay

## 2017-10-15 ENCOUNTER — Ambulatory Visit (INDEPENDENT_AMBULATORY_CARE_PROVIDER_SITE_OTHER): Payer: Commercial Managed Care - PPO | Admitting: Licensed Clinical Social Worker

## 2017-10-15 DIAGNOSIS — F4010 Social phobia, unspecified: Secondary | ICD-10-CM
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
## 2017-11-18 ENCOUNTER — Ambulatory Visit (INDEPENDENT_AMBULATORY_CARE_PROVIDER_SITE_OTHER): Payer: Commercial Managed Care - PPO | Admitting: Licensed Clinical Social Worker

## 2017-11-18 ENCOUNTER — Encounter (HOSPITAL_COMMUNITY): Payer: Self-pay | Admitting: Licensed Clinical Social Worker

## 2017-11-18 DIAGNOSIS — F4010 Social phobia, unspecified: Secondary | ICD-10-CM

## 2017-11-18 NOTE — Progress Notes (Signed)
   THERAPIST PROGRESS NOTE  Session Time: 12:30pm-1:30pm  Participation Level: Active  Behavioral Response: Well GroomedAlertAnxious  Type of Therapy: Individual Therapy  Treatment Goals addressed: Improve psychiatric symptoms, improve unhelpful thought patterns, emotional regulation skills, improve social skills  Interventions: CBT and Motivational Interviewing Grounding and Mindfulness techniques, psycho education  Summary: Jim Smith is a 50 y.o. male who presents with Social Anxiety D/O.   Suicidal/Homicidal: No - without intent/plan  Therapist Response: Jim Smith met with clinician for an individual session. Jim Smith discussed his current life events, his psychiatric symptoms, and his homework. Jim Smith reports he has not been in too many uncomfortable social situations over the past few weeks. However, his primary care physician prescribed Propranolol as needed for public speaking. Jim Smith reports he has only taken it once and he got a headache, but he was uncertain if there was a connection. Jim Smith reports he feels he has no Education officer, community. He identified discomfort in large crowds, such as work functions, holiday parties, Social research officer, government. Jim Smith reports he feels very comfortable with his family. Clinician explored interactions with family members and identified a close connection to extended family. Clinician also discussed interactions with daughter and wife.    Plan: Return again in 2-3 weeks.  Diagnosis:     Axis I: Social Anxiety Disorder   Mindi Curling, LCSW 11/18/2017

## 2018-01-13 ENCOUNTER — Encounter (HOSPITAL_COMMUNITY): Payer: Self-pay | Admitting: Licensed Clinical Social Worker

## 2018-01-13 ENCOUNTER — Ambulatory Visit (INDEPENDENT_AMBULATORY_CARE_PROVIDER_SITE_OTHER): Payer: Commercial Managed Care - PPO | Admitting: Licensed Clinical Social Worker

## 2018-01-13 DIAGNOSIS — F4010 Social phobia, unspecified: Secondary | ICD-10-CM

## 2018-01-13 NOTE — Progress Notes (Signed)
   THERAPIST PROGRESS NOTE  Session Time: 12:30pm-1:30pm  Participation Level: Active  Behavioral Response: Well GroomedAlertAnxious  Type of Therapy: Individual Therapy  Treatment Goals addressed: Improve psychiatric symptoms, improve unhelpful thought patterns, emotional regulation skills, improve social skills  Interventions: CBT and Motivational Interviewing Grounding and Mindfulness techniques, psycho education  Summary: Jim Smith is a 52 y.o. male who presents with Social Anxiety D/O.   Suicidal/Homicidal: No - without intent/plan  Therapist Response: Herbie Baltimore met with clinician for an individual session. Pau discussed his current life events, his psychiatric symptoms, and his homework. Chord reports he continues to suffer with social anxiety, especially at work. He identified recent incident where he was giving a presentation and he became tongue-tied and lost his train of thought. Clinician explored the outcomes of the incident and how others perceived what happened. Edyn identified that no one said anything positive or negative about it, but he felt badly about himself later that evening. Carlee reports worry that he will not be chosen for promotions or leadership opportunities due to his lack of social skills and lack of confidence. Clinician explored ways to improve his comfort with public speaking. Daryl also processed problems with workplace bullying and hx of bullying throughout his life. Clinician encouraged Terral to go directly to HR if any other incidents occur with the previous Freight forwarder.    Plan: Return again in 3-4 weeks.  Diagnosis:     Axis I: Social Anxiety Disorder  Mindi Curling, LCSW 01/13/2018

## 2018-03-05 ENCOUNTER — Ambulatory Visit (HOSPITAL_COMMUNITY): Payer: Self-pay | Admitting: Licensed Clinical Social Worker

## 2018-04-21 ENCOUNTER — Ambulatory Visit (HOSPITAL_COMMUNITY): Payer: Commercial Managed Care - PPO | Admitting: Licensed Clinical Social Worker

## 2018-05-11 ENCOUNTER — Ambulatory Visit (HOSPITAL_COMMUNITY): Payer: Commercial Managed Care - PPO | Admitting: Licensed Clinical Social Worker

## 2018-05-27 ENCOUNTER — Ambulatory Visit (HOSPITAL_COMMUNITY): Payer: Commercial Managed Care - PPO | Admitting: Licensed Clinical Social Worker

## 2019-09-15 ENCOUNTER — Emergency Department (HOSPITAL_BASED_OUTPATIENT_CLINIC_OR_DEPARTMENT_OTHER): Payer: Commercial Managed Care - PPO

## 2019-09-15 ENCOUNTER — Emergency Department (HOSPITAL_BASED_OUTPATIENT_CLINIC_OR_DEPARTMENT_OTHER)
Admission: EM | Admit: 2019-09-15 | Discharge: 2019-09-15 | Disposition: A | Discharge status: Home / Self Care | Payer: Commercial Managed Care - PPO | Attending: Emergency Medicine | Admitting: Emergency Medicine

## 2019-09-15 ENCOUNTER — Other Ambulatory Visit: Payer: Self-pay

## 2019-09-15 ENCOUNTER — Encounter (HOSPITAL_BASED_OUTPATIENT_CLINIC_OR_DEPARTMENT_OTHER): Payer: Self-pay | Admitting: *Deleted

## 2019-09-15 DIAGNOSIS — Z79899 Other long term (current) drug therapy: Secondary | ICD-10-CM | POA: Diagnosis not present

## 2019-09-15 DIAGNOSIS — R0789 Other chest pain: Secondary | ICD-10-CM | POA: Diagnosis not present

## 2019-09-15 DIAGNOSIS — R0600 Dyspnea, unspecified: Secondary | ICD-10-CM | POA: Diagnosis not present

## 2019-09-15 LAB — CBC
HCT: 46.2 % (ref 39.0–52.0)
Hemoglobin: 15.2 g/dL (ref 13.0–17.0)
MCH: 29.9 pg (ref 26.0–34.0)
MCHC: 32.9 g/dL (ref 30.0–36.0)
MCV: 90.9 fL (ref 80.0–100.0)
Platelets: 274 10*3/uL (ref 150–400)
RBC: 5.08 MIL/uL (ref 4.22–5.81)
RDW: 12.7 % (ref 11.5–15.5)
WBC: 8.4 10*3/uL (ref 4.0–10.5)
nRBC: 0 % (ref 0.0–0.2)

## 2019-09-15 LAB — BASIC METABOLIC PANEL
Anion gap: 11 (ref 5–15)
BUN: 17 mg/dL (ref 6–20)
CO2: 27 mmol/L (ref 22–32)
Calcium: 9.6 mg/dL (ref 8.9–10.3)
Chloride: 97 mmol/L — ABNORMAL LOW (ref 98–111)
Creatinine, Ser: 1.15 mg/dL (ref 0.61–1.24)
GFR calc Af Amer: >60 mL/min (ref >60)
GFR calc non Af Amer: >60 mL/min (ref >60)
Glucose, Bld: 105 mg/dL — ABNORMAL HIGH (ref 70–99)
Potassium: 4.4 mmol/L (ref 3.5–5.1)
Sodium: 135 mmol/L (ref 135–145)

## 2019-09-15 LAB — TROPONIN I (HIGH SENSITIVITY)
Troponin I (High Sensitivity): 2 ng/L (ref <18)
Troponin I (High Sensitivity): 3 ng/L (ref <18)

## 2019-09-15 LAB — D-DIMER, QUANTITATIVE: D-Dimer, Quant: <0.27 ug/mL-FEU (ref 0.00–0.50)

## 2019-09-15 NOTE — ED Triage Notes (Signed)
Pt reports he has had sob x January, has seen a cardiologist and had stress test and "multiple scans" with no findings. Pt states today he noticed a tightness to his left chest along with the sob, so came here for further eval. States he has not been able to use his cpap in months due to skin issues. ekg performed during triage, given to dr. Rex Kras by Marcie Bal, emt.

## 2019-09-15 NOTE — ED Notes (Signed)
X-ray at bedside

## 2019-09-15 NOTE — Discharge Instructions (Signed)
Mr. Toran, Murch were evaluated in the ED today for left sided chest discomfort for one week duration. Your EKG, chest x-ray, and marker for cardiac damage were normal. All other labs were within normal limits. At this time, it is likely that your pain is musculoskeletal. I recommend symptomatic treatment for your pain. Please follow up with your PCP for further management.  If your symptoms worsen, or you develop a cough with fever, shortness of breath on exertion associated with the chest pain, please seek emergent medical care.  Thank you!

## 2019-09-15 NOTE — ED Provider Notes (Signed)
[facility] EMERGENCY DEPARTMENT Provider Note   [name]: [id number] Arrival date & time: 09/15/19  1119     History   Chief Complaint Chief Complaint  Patient presents with  . Chest Pain    HPI Jim Smith is a 53 y.o. male with hx of HLD and sleep apnea presenting today for left sided chest discomfort with left arm pain this morning. Patient has dyspnea on exertion since the beginning of this year for which he has been extensively worked up for and is thought to be secondary to obstructive sleep apnea and he was given CPAP however, he has not been using the CPAP as it causes allergic reaction. He denies any shortness of breath with the chest discomfort and reports that it feels more like a pulled muscle. He was concerned as it was left sided. No symptoms concerning for ACS. He denies retrosternal chest pain, diaphoresis, headache, dizziness, nausea or vomiting.     Past Medical History:  Diagnosis Date  . Acute ear infection   . Anxiety   . Bronchitis   . GERD (gastroesophageal reflux disease)   . H/O removal of neck cyst 01/2012   R anterior neck I&D, with abx  . Hyperlipidemia   . Migraine   . Sleep apnea    uses Bipap occaisionally    Patient Active Problem List   Diagnosis Date Noted  . Migraine 08/04/2017  . Sleep apnea 08/04/2017  . Anxiety 08/04/2017  . 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 FRACTURE SURGERY Left 2009   tibial fx  . PILONIDAL CYST EXCISION    . SUBMANDIBULAR GLAND EXCISION Right 05/17/2016   Procedure: INTRA ORAL EXCISION RIGHT SUBLINGUAL GLAND;  Surgeon: Serena Colonel, MD;  Location: [facility];  Service: ENT;  Laterality: Right;        Home Medications    Prior to Admission medications   Medication Sig Start Date End Date Taking? Authorizing Provider  amitriptyline (ELAVIL) 10 MG tablet Take 10 mg by mouth daily. 12/27/15   [provider]   atorvastatin (LIPITOR) 10 MG tablet Take 10 mg by mouth daily. 03/11/16   [provider]  buPROPion (WELLBUTRIN SR) 200 MG 12 hr tablet Take 200 mg by mouth 2 (two) times daily. 12/15/15   [provider]  clindamycin (CLEOCIN) 75 MG/5ML solution Take 20 mLs (300 mg total) by mouth 3 (three) times daily. 05/19/16   Christia Reading, MD  Dexlansoprazole (DEXILANT) 30 MG capsule Take 30 mg by mouth daily.    [provider]  fenofibrate 160 MG tablet Take 160 mg by mouth daily. 03/11/16   [provider]  ibuprofen (ADVIL,MOTRIN) 200 MG tablet Take 200 mg by mouth every 4 (four) hours as needed (pain).    [provider]  nortriptyline (PAMELOR) 25 MG capsule Take 2 capsules (50 mg total) by mouth at bedtime. 08/04/17   Levert Feinstein, MD    Family History Family History  Problem Relation Age of Onset  . Parkinson's disease Mother   . Depression Father   . Liver cancer Maternal Grandfather     Social History Social History   Tobacco Use  . Smoking status: Never Smoker  . Smokeless tobacco: Never Used  Substance Use Topics  . Alcohol use: No  . Drug use: No     Allergies   Morphine and related and Penicillins   Review of Systems Review of Systems  Constitutional: Negative for activity change, chills, diaphoresis and fever.  HENT: Negative.   Eyes: Negative.   Respiratory: Positive for apnea and shortness of breath. Negative for cough, chest tightness, wheezing and stridor.   Cardiovascular: Positive for chest pain. Negative for palpitations.  Gastrointestinal: Negative for abdominal pain, diarrhea, nausea and vomiting.  Endocrine: Negative.   Genitourinary: Negative.   Musculoskeletal: Positive for myalgias.  Skin: Negative.   Allergic/Immunologic: Negative.   Neurological: Negative for dizziness, weakness, numbness and headaches.  Hematological: Negative.   Psychiatric/Behavioral: Negative.   All other systems reviewed and are  negative.    Physical Exam Updated Vital Signs BP 131/90   Pulse 84   Temp 98 F (36.7 C) (Oral)   Resp 18   Ht 6\' 2"  (1.88 m)   Wt 101.6 kg   SpO2 98%   BMI 28.76 kg/m   Physical Exam Vitals signs reviewed.  Constitutional:      General: He is not in acute distress.    Appearance: He is well-developed. He is not diaphoretic.  HENT:     Head: Normocephalic and atraumatic.  Eyes:     Extraocular Movements: Extraocular movements intact.     Pupils: Pupils are equal, round, and reactive to light.  Neck:     Musculoskeletal: Normal range of motion and neck supple.     Vascular: No JVD.  Cardiovascular:     Rate and Rhythm: Normal rate and regular rhythm.     Heart sounds: Normal heart sounds. No friction rub. No gallop. No S3 sounds.   Pulmonary:     Effort: Pulmonary effort is normal. No tachypnea or respiratory distress.     Breath sounds: Normal breath sounds. No stridor.  Chest:     Chest wall: Tenderness present.     Comments: Left sided chest tenderness to palpation  Abdominal:     General: Bowel sounds are normal.     Palpations: Abdomen is soft. There is no mass.     Tenderness: There is no abdominal tenderness. There is no guarding.  Musculoskeletal: Normal range of motion.  Lymphadenopathy:     Cervical: No cervical adenopathy.  Skin:    General: Skin is warm and dry.     Capillary Refill: Capillary refill takes less than 2 seconds.  Neurological:     General: No focal deficit present.     Mental Status: He is alert and oriented to person, place, and time.     Cranial Nerves: No cranial nerve deficit.     Motor: No weakness.      ED Treatments / Results  Labs (all labs ordered are listed, but only abnormal results are displayed) Labs Reviewed  BASIC METABOLIC PANEL - Abnormal; Notable for the following components:      Result Value   Chloride 97 (*)    Glucose, Bld 105 (*)    All other components within normal limits  CBC  D-DIMER,  QUANTITATIVE (NOT AT [facility])  TROPONIN I (HIGH SENSITIVITY)  TROPONIN I (HIGH SENSITIVITY)    EKG EKG Interpretation  Date/Time:  Wednesday September 15 2019 11:33:54 EDT Ventricular Rate:  76 PR Interval:  198 QRS Duration: 98 QT Interval:  372 QTC Calculation: 418 R Axis:   80 Text Interpretation:  Normal sinus rhythm Normal ECG No significant change since last tracing Confirmed by Frederick PeersLittle, Rachel [phone]([id number]) on 09/15/2019 12:54:26 PM   Radiology Dg Chest Port 1 View  Result Date: 09/15/2019 CLINICAL DATA:  Chest pain EXAM: PORTABLE CHEST 1  VIEW COMPARISON:  March 25, 2012 FINDINGS: There is slight scarring in the left base. The lungs elsewhere are clear. Heart size and pulmonary vascularity are normal. No adenopathy. No pneumothorax. No bone lesions. IMPRESSION: Minimal scarring left base. No edema or consolidation. Heart size within normal limits. Electronically Signed   By: Lowella Grip III M.D.   On: 09/15/2019 13:23    Procedures Procedures (including critical care time)  Medications Ordered in ED Medications - No data to display   Initial Impression / Assessment and Plan / ED Course  I have reviewed the triage vital signs and the nursing notes.  Pertinent labs & imaging results that were available during my care of the patient were reviewed by me and considered in my medical decision making (see chart for details).   Patient is a 53 year old male with a history of hyperlipidemia and sleep apnea presenting today for left-sided chest discomfort and arm pain since this morning.  Patient has been extensively worked up for dyspnea on exertion that started in January of this year.  His dyspnea on exertion has been attributed to his sleep apnea.  Patient was previously using CPAP however, due to skin reaction  he stopped using it a few months ago.  This morning patient reported that he noticed some left-sided chest discomfort and left arm pain.  He is concerned due to the location  of the pain however he denies any retrosternal chest pain, shortness of breath associated with the chest pain, diaphoresis.  On examination, vital signs are stable and patient does not appear to be in any acute distress.  EKG is normal sinus rhythm.  Cardiopulmonary examination is benign.  Chest pain is reproducible on left upper chest.  Chest x-ray without any acute cardiopulmonary findings.  Serial troponins and d-dimer are negative. Chest pain does not appear to be ACS in origin.  Given history of dyspnea on exertion, D-dimer obtained to rule out PE. D-dimer <0.27 and patient is low risk for PE.  Chest pain is likely musculoskeletal in origin and can be treated symptomatically.Patient discharged to home with recommendations for symptomatic treatment and follow up with PCP for further management of dyspnea on exertion.  Patient given return precautions.    Final Clinical Impressions(s) / ED Diagnoses   Final diagnoses:  Atypical chest pain    ED Discharge Orders    None       Harvie Heck, MD 09/15/19 1641    Little, Wenda Overland, MD 09/17/19 1028

## 2019-10-04 ENCOUNTER — Other Ambulatory Visit: Payer: Self-pay | Admitting: Cardiology

## 2019-10-04 ENCOUNTER — Other Ambulatory Visit: Payer: Self-pay | Admitting: Anesthesiology

## 2019-10-04 ENCOUNTER — Other Ambulatory Visit: Payer: Self-pay | Admitting: *Deleted

## 2019-10-04 ENCOUNTER — Other Ambulatory Visit (HOSPITAL_COMMUNITY): Payer: Self-pay | Admitting: Cardiology

## 2019-10-04 DIAGNOSIS — R0600 Dyspnea, unspecified: Secondary | ICD-10-CM

## 2019-10-04 DIAGNOSIS — R072 Precordial pain: Secondary | ICD-10-CM

## 2019-10-04 DIAGNOSIS — R079 Chest pain, unspecified: Secondary | ICD-10-CM

## 2019-10-11 ENCOUNTER — Telehealth (HOSPITAL_COMMUNITY): Payer: Self-pay | Admitting: Emergency Medicine

## 2019-10-11 NOTE — Telephone Encounter (Signed)
Reaching out to patient to offer assistance regarding upcoming cardiac imaging study; pt verbalizes understanding of appt date/time, parking situation and where to check in, pre-test NPO status and medications ordered, and verified current allergies; name and call back number provided for further questions should they arise Somer Trotter RN Navigator Cardiac Imaging [facility] [phone] office [phone] cell 

## 2019-10-11 NOTE — Telephone Encounter (Signed)
Left message on voicemail with name and callback number [name] RN Navigator Cardiac Imaging  [facility] [phone] Office [phone] Cell  

## 2019-10-12 ENCOUNTER — Other Ambulatory Visit (HOSPITAL_COMMUNITY): Payer: Commercial Managed Care - PPO

## 2019-10-12 ENCOUNTER — Other Ambulatory Visit: Payer: Self-pay

## 2019-10-12 ENCOUNTER — Encounter (HOSPITAL_COMMUNITY): Payer: Self-pay

## 2019-10-12 ENCOUNTER — Ambulatory Visit (HOSPITAL_COMMUNITY)
Admission: RE | Admit: 2019-10-12 | Discharge: 2019-10-12 | Disposition: A | Discharge status: Home / Self Care | Payer: Commercial Managed Care - PPO | Source: Ambulatory Visit | Attending: Cardiology | Admitting: Cardiology

## 2019-10-12 ENCOUNTER — Encounter: Payer: Commercial Managed Care - PPO | Admitting: *Deleted

## 2019-10-12 DIAGNOSIS — Z006 Encounter for examination for normal comparison and control in clinical research program: Secondary | ICD-10-CM

## 2019-10-12 DIAGNOSIS — R072 Precordial pain: Secondary | ICD-10-CM | POA: Diagnosis not present

## 2019-10-12 MED ORDER — NITROGLYCERIN 0.4 MG SL SUBL
SUBLINGUAL_TABLET | SUBLINGUAL | Status: AC
  Filled 2019-10-12: qty 1

## 2019-10-12 MED ORDER — IOHEXOL 350 MG/ML SOLN
100.0000 mL | Freq: Once | INTRAVENOUS | Status: AC | PRN
  Administered 2019-10-12: 16:00:00 100 mL via INTRAVENOUS

## 2019-10-12 MED ORDER — METOPROLOL TARTRATE 5 MG/5ML IV SOLN
5.0000 mg | INTRAVENOUS | Status: DC | PRN
  Administered 2019-10-12: 15:00:00 5 mg via INTRAVENOUS
  Filled 2019-10-12: qty 5

## 2019-10-12 MED ORDER — METOPROLOL TARTRATE 5 MG/5ML IV SOLN
INTRAVENOUS | Status: AC
  Filled 2019-10-12: qty 5

## 2019-10-12 MED ORDER — NITROGLYCERIN 0.4 MG SL SUBL
0.8000 mg | SUBLINGUAL_TABLET | Freq: Once | SUBLINGUAL | Status: DC
  Filled 2019-10-12: qty 25

## 2019-10-12 NOTE — Research (Signed)
CADFEM Informed Consent                  Subject Name:   Jim Smith   Subject met inclusion and exclusion criteria.  The informed consent form, study requirements and expectations were reviewed with the subject and questions and concerns were addressed prior to the signing of the consent form.  The subject verbalized understanding of the trial requirements.  The subject agreed to participate in the CADFEM trial and signed the informed consent.  The informed consent was obtained prior to performance of any protocol-specific procedures for the subject.  A copy of the signed informed consent was given to the subject and a copy was placed in the subject's medical record.   [place] Adonis Ryther, Research Assistant 10/12/2019 14:20p.m.

## 2019-11-22 ENCOUNTER — Emergency Department (HOSPITAL_COMMUNITY): Payer: Commercial Managed Care - PPO

## 2019-11-22 ENCOUNTER — Other Ambulatory Visit: Payer: Self-pay

## 2019-11-22 ENCOUNTER — Encounter (HOSPITAL_COMMUNITY): Payer: Self-pay | Admitting: Emergency Medicine

## 2019-11-22 ENCOUNTER — Emergency Department (HOSPITAL_COMMUNITY)
Admission: EM | Admit: 2019-11-22 | Discharge: 2019-11-22 | Disposition: A | Discharge status: Home / Self Care | Payer: Commercial Managed Care - PPO | Attending: Emergency Medicine | Admitting: Emergency Medicine

## 2019-11-22 DIAGNOSIS — Z880 Allergy status to penicillin: Secondary | ICD-10-CM | POA: Diagnosis not present

## 2019-11-22 DIAGNOSIS — E785 Hyperlipidemia, unspecified: Secondary | ICD-10-CM | POA: Diagnosis not present

## 2019-11-22 DIAGNOSIS — Z885 Allergy status to narcotic agent status: Secondary | ICD-10-CM | POA: Insufficient documentation

## 2019-11-22 DIAGNOSIS — R05 Cough: Secondary | ICD-10-CM | POA: Diagnosis present

## 2019-11-22 DIAGNOSIS — J069 Acute upper respiratory infection, unspecified: Secondary | ICD-10-CM | POA: Diagnosis not present

## 2019-11-22 DIAGNOSIS — Z20828 Contact with and (suspected) exposure to other viral communicable diseases: Secondary | ICD-10-CM | POA: Diagnosis not present

## 2019-11-22 DIAGNOSIS — R059 Cough, unspecified: Secondary | ICD-10-CM

## 2019-11-22 LAB — CBC WITH DIFFERENTIAL/PLATELET
Abs Immature Granulocytes: 0.03 10*3/uL (ref 0.00–0.07)
Basophils Absolute: 0.1 10*3/uL (ref 0.0–0.1)
Basophils Relative: 1 %
Eosinophils Absolute: 0.1 10*3/uL (ref 0.0–0.5)
Eosinophils Relative: 2 %
HCT: 46 % (ref 39.0–52.0)
Hemoglobin: 15.5 g/dL (ref 13.0–17.0)
Immature Granulocytes: 0 %
Lymphocytes Relative: 32 %
Lymphs Abs: 2.3 10*3/uL (ref 0.7–4.0)
MCH: 30.8 pg (ref 26.0–34.0)
MCHC: 33.7 g/dL (ref 30.0–36.0)
MCV: 91.5 fL (ref 80.0–100.0)
Monocytes Absolute: 0.9 10*3/uL (ref 0.1–1.0)
Monocytes Relative: 13 %
Neutro Abs: 3.8 10*3/uL (ref 1.7–7.7)
Neutrophils Relative %: 52 %
Platelets: 283 10*3/uL (ref 150–400)
RBC: 5.03 MIL/uL (ref 4.22–5.81)
RDW: 13 % (ref 11.5–15.5)
WBC: 7.3 10*3/uL (ref 4.0–10.5)
nRBC: 0 % (ref 0.0–0.2)

## 2019-11-22 LAB — BASIC METABOLIC PANEL
Anion gap: 9 (ref 5–15)
BUN: 13 mg/dL (ref 6–20)
CO2: 27 mmol/L (ref 22–32)
Calcium: 9.2 mg/dL (ref 8.9–10.3)
Chloride: 99 mmol/L (ref 98–111)
Creatinine, Ser: 1.19 mg/dL (ref 0.61–1.24)
GFR calc Af Amer: >60 mL/min (ref >60)
GFR calc non Af Amer: >60 mL/min (ref >60)
Glucose, Bld: 112 mg/dL — ABNORMAL HIGH (ref 70–99)
Potassium: 4.2 mmol/L (ref 3.5–5.1)
Sodium: 135 mmol/L (ref 135–145)

## 2019-11-22 MED ORDER — BENZONATATE 100 MG PO CAPS: 100.0000 mg | ORAL_CAPSULE | Freq: Three times a day (TID) | ORAL | 0 refills | Status: DC | PRN

## 2019-11-22 NOTE — ED Provider Notes (Signed)
Emergency Department Provider Note   I have reviewed the triage vital signs and the nursing notes.   HISTORY  Chief Complaint Cough   HPI Jim Smith is a 53 y.o. male with past medical history reviewed below presents to the emergency department with worsening cough, fatigue symptoms.  Patient states that symptoms became severe today but this is approximately day 4 of coughing.  He reports some subjective fever with T-max at home of 3 F.  Denies shaking chills or chest pain.  He states that symptoms have been managed until today when he began to feel much more fatigued and had a severe coughing episode.  Denies hemoptysis.  No vomiting or diarrhea.  No known sick contacts but has been doing essential shopping and occasionally encountering people without masks.  No radiation of symptoms or other modifying factors.  Patient denies smoking cigarettes.    Past Medical History:  Diagnosis Date  . Acute ear infection   . Anxiety   . Bronchitis   . GERD (gastroesophageal reflux disease)   . H/O removal of neck cyst 01/2012   R anterior neck I&D, with abx  . Hyperlipidemia   . Migraine   . Sleep apnea    uses Bipap occaisionally    Patient Active Problem List   Diagnosis Date Noted  . Migraine 08/04/2017  . Sleep apnea 08/04/2017  . Anxiety 08/04/2017  . 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 FRACTURE SURGERY Left 2009   tibial fx  . PILONIDAL CYST EXCISION    . SUBMANDIBULAR GLAND EXCISION Right 05/17/2016   Procedure: INTRA ORAL EXCISION RIGHT SUBLINGUAL GLAND;  Surgeon: Serena Colonel, MD;  Location: [facility];  Service: ENT;  Laterality: Right;    Allergies Morphine and related and Penicillins  Family History  Problem Relation Age of Onset  . Parkinson's disease Mother   . Depression Father   . Liver cancer Maternal Grandfather     Social History Social History   Tobacco Use  . Smoking  status: Never Smoker  . Smokeless tobacco: Never Used  Substance Use Topics  . Alcohol use: No  . Drug use: No    Review of Systems  Constitutional: Subjective fever.  Eyes: No visual changes. ENT: No sore throat. Mild congestion.  Cardiovascular: Denies chest pain. Respiratory: Denies shortness of breath. Positive cough.  Gastrointestinal: No abdominal pain.  No nausea, no vomiting.  No diarrhea.  No constipation. Genitourinary: Negative for dysuria. Musculoskeletal: Negative for back pain. Skin: Negative for rash. Neurological: Negative for headaches, focal weakness or numbness.  10-point ROS otherwise negative.  ____________________________________________   PHYSICAL EXAM:  VITAL SIGNS: ED Triage Vitals  Enc Vitals Group     BP 11/22/19 1634 128/89     Pulse Rate 11/22/19 1634 (!) 52     Resp 11/22/19 1634 18     Temp 11/22/19 1634 98.1 F (36.7 C)     Temp Source 11/22/19 1634 Oral     SpO2 11/22/19 1634 99 %   Constitutional: Alert and oriented. Well appearing and in no acute distress. Eyes: Conjunctivae are normal. Head: Atraumatic. Nose: No congestion/rhinnorhea. Mouth/Throat: Mucous membranes are moist. Neck: No stridor.   Cardiovascular: Normal rate, regular rhythm. Good peripheral circulation. Grossly normal heart sounds.   Respiratory: Normal respiratory effort.  No retractions. Lungs CTAB. Gastrointestinal: No distention.  Musculoskeletal: No gross deformities of extremities. Neurologic:  Normal speech and language. Skin:  Skin is warm, dry and intact. No rash noted.   ____________________________________________   LABS (all labs ordered are listed, but only abnormal results are displayed)  Labs Reviewed  BASIC METABOLIC PANEL - Abnormal; Notable for the following components:      Result Value   Glucose, Bld 112 (*)    All other components within normal limits  NOVEL CORONAVIRUS, NAA (HOSP ORDER, SEND-OUT TO REF LAB; TAT 18-24 HRS)  CBC WITH  DIFFERENTIAL/PLATELET   ____________________________________________  EKG   EKG Interpretation  Date/Time:  Monday November 22 2019 18:31:56 EST Ventricular Rate:  48 PR Interval:    QRS Duration: 95 QT Interval:  441 QTC Calculation: 394 R Axis:   84 Text Interpretation: Sinus bradycardia Borderline prolonged PR interval no STEMI Confirmed by Nanda Quinton [phone]) on 11/22/2019 6:50:01 PM       ____________________________________________  RADIOLOGY  Dg Chest Portable 1 View  Result Date: 11/22/2019 CLINICAL DATA:  Cough EXAM: PORTABLE CHEST 1 VIEW COMPARISON:  09/15/2019 FINDINGS: The heart size and mediastinal contours are within normal limits. Both lungs are clear. The visualized skeletal structures are unremarkable. IMPRESSION: No active disease. Electronically Signed   By: Constance Holster M.D.   On: 11/22/2019 18:45    ____________________________________________   PROCEDURES  Procedure(s) performed:   Procedures  None  ____________________________________________   INITIAL IMPRESSION / ASSESSMENT AND PLAN / ED COURSE  Pertinent labs & imaging results that were available during my care of the patient were reviewed by me and considered in my medical decision making (see chart for details).   Patient presents to the emergency department with cough and subjective fevers worsening in the last 24 hours.  No specific shortness of breath symptoms.  He is not hypoxemic in triage.  Plan for chest x-ray, ambulation on pulse ox, and likely COVID-19 testing from the emergency department.  Patient with pulse of 52 on arrival.  Will follow this with EKG and reassess.  No chest pain.  Extremely low suspicion for ACS and/or PE.   07:21 PM  The tech reported to me that the patient ambulated well on pulse ox without hypoxemia or shortness of breath/subjective symptoms.  Initially reported some tachycardia but suspect this may be an equipment error.  The patient has been  running more bradycardic.  With report of tachycardia I did obtain blood work which is normal.  Chest x-ray reviewed with no clear findings.  Plan for [facility] for symptom relief.  Patient to remain in isolation and follow COVID-19 test results in MyChart.  Discussed need for PCP follow-up and ED return precautions were discussed as well. Patient pleased at discharge.   Juston Goheen was evaluated in Emergency Department on 11/22/2019 for the symptoms described in the history of present illness. He was evaluated in the context of the global COVID-19 pandemic, which necessitated consideration that the patient might be at risk for infection with the SARS-CoV-2 virus that causes COVID-19. Institutional protocols and algorithms that pertain to the evaluation of patients at risk for COVID-19 are in a state of rapid change based on information released by regulatory bodies including the CDC and federal and state organizations. These policies and algorithms were followed during the patient's care in the ED.    ____________________________________________  FINAL CLINICAL IMPRESSION(S) / ED DIAGNOSES  Final diagnoses:  Viral upper respiratory tract infection  Cough    NEW OUTPATIENT MEDICATIONS STARTED DURING THIS VISIT:  New Prescriptions   BENZONATATE (TESSALON) 100 MG CAPSULE  Take 1 capsule (100 mg total) by mouth every 8 (eight) hours as needed for cough.    Note:  This document was prepared using Dragon voice recognition software and may include unintentional dictation errors.  Alona BeneJoshua Damali Broadfoot, MD, [facility] Emergency [facility]    Adrionna Delcid, Arlyss RepressJoshua G, MD 11/22/19 Ernestina [id number]

## 2019-11-22 NOTE — ED Triage Notes (Signed)
Pt c/o cough that started Friday and has gotten progressively worse. Reports mild fever at home and nasal congestion. No known COVID+ contacts.

## 2019-11-22 NOTE — ED Notes (Signed)
Pt verbalized understanding of d/c, follow up and when to return to ED. Pt verbalized understanding of isolation and following up with COVID swab result.

## 2019-11-22 NOTE — ED Notes (Signed)
Ambulated pt in the room pt sats remained at 97% on room air no complaints noted at this time

## 2019-11-22 NOTE — Discharge Instructions (Signed)
He was seen in the emergency department today with cough.  Your chest x-ray is clear.  I am testing you for COVID-19.  You will need to stay isolated at home until this test result comes back into been feeling well/without fever for at least 3 days.  You can follow the results of the test in MyChart.  There is information on how to set up this app in the discharge paperwork.  Please return to the emergency department immediately with any worsening symptoms such as shortness of breath, chest pain, confusion, or other severe symptoms.

## 2019-11-24 ENCOUNTER — Telehealth: Payer: Self-pay | Admitting: *Deleted

## 2019-11-24 LAB — NOVEL CORONAVIRUS, NAA (HOSP ORDER, SEND-OUT TO REF LAB; TAT 18-24 HRS): SARS-CoV-2, NAA: NOT DETECTED

## 2019-11-24 NOTE — Telephone Encounter (Signed)
Pt calling for covid results. Tested in ED 11/22/2019. Still pending, not resulted yet. Pt states he has been receiving EMails that there is a message in [place] but nothing is noted there.  IT # given for assist with MyChart issues.

## 2020-09-22 ENCOUNTER — Emergency Department (HOSPITAL_COMMUNITY)
Admission: EM | Admit: 2020-09-22 | Discharge: 2020-09-22 | Disposition: A | Discharge status: Home / Self Care | Payer: Commercial Managed Care - PPO | Attending: Emergency Medicine | Admitting: Emergency Medicine

## 2020-09-22 ENCOUNTER — Emergency Department (HOSPITAL_COMMUNITY): Payer: Commercial Managed Care - PPO

## 2020-09-22 ENCOUNTER — Other Ambulatory Visit: Payer: Self-pay

## 2020-09-22 ENCOUNTER — Other Ambulatory Visit: Payer: Commercial Managed Care - PPO

## 2020-09-22 ENCOUNTER — Encounter (HOSPITAL_COMMUNITY): Payer: Self-pay | Admitting: Emergency Medicine

## 2020-09-22 DIAGNOSIS — R110 Nausea: Secondary | ICD-10-CM | POA: Insufficient documentation

## 2020-09-22 DIAGNOSIS — R0602 Shortness of breath: Secondary | ICD-10-CM | POA: Diagnosis not present

## 2020-09-22 DIAGNOSIS — R42 Dizziness and giddiness: Secondary | ICD-10-CM | POA: Diagnosis not present

## 2020-09-22 DIAGNOSIS — Z5321 Procedure and treatment not carried out due to patient leaving prior to being seen by health care provider: Secondary | ICD-10-CM | POA: Diagnosis not present

## 2020-09-22 LAB — COMPREHENSIVE METABOLIC PANEL
ALT: 27 U/L (ref 0–44)
AST: 28 U/L (ref 15–41)
Albumin: 4.3 g/dL (ref 3.5–5.0)
Alkaline Phosphatase: 54 U/L (ref 38–126)
Anion gap: 14 (ref 5–15)
BUN: 14 mg/dL (ref 6–20)
CO2: 22 mmol/L (ref 22–32)
Calcium: 9.3 mg/dL (ref 8.9–10.3)
Chloride: 97 mmol/L — ABNORMAL LOW (ref 98–111)
Creatinine, Ser: 1.43 mg/dL — ABNORMAL HIGH (ref 0.61–1.24)
GFR calc Af Amer: >60 mL/min (ref >60)
GFR calc non Af Amer: 55 mL/min — ABNORMAL LOW (ref >60)
Glucose, Bld: 186 mg/dL — ABNORMAL HIGH (ref 70–99)
Potassium: 4.6 mmol/L (ref 3.5–5.1)
Sodium: 133 mmol/L — ABNORMAL LOW (ref 135–145)
Total Bilirubin: 1.3 mg/dL — ABNORMAL HIGH (ref 0.3–1.2)
Total Protein: 7.1 g/dL (ref 6.5–8.1)

## 2020-09-22 LAB — CBC WITH DIFFERENTIAL/PLATELET
Abs Immature Granulocytes: 0.03 10*3/uL (ref 0.00–0.07)
Basophils Absolute: 0.1 10*3/uL (ref 0.0–0.1)
Basophils Relative: 1 %
Eosinophils Absolute: 0.1 10*3/uL (ref 0.0–0.5)
Eosinophils Relative: 1 %
HCT: 45.8 % (ref 39.0–52.0)
Hemoglobin: 15 g/dL (ref 13.0–17.0)
Immature Granulocytes: 0 %
Lymphocytes Relative: 28 %
Lymphs Abs: 2.2 10*3/uL (ref 0.7–4.0)
MCH: 30.4 pg (ref 26.0–34.0)
MCHC: 32.8 g/dL (ref 30.0–36.0)
MCV: 92.7 fL (ref 80.0–100.0)
Monocytes Absolute: 0.6 10*3/uL (ref 0.1–1.0)
Monocytes Relative: 8 %
Neutro Abs: 4.9 10*3/uL (ref 1.7–7.7)
Neutrophils Relative %: 62 %
Platelets: 304 10*3/uL (ref 150–400)
RBC: 4.94 MIL/uL (ref 4.22–5.81)
RDW: 12.5 % (ref 11.5–15.5)
WBC: 7.9 10*3/uL (ref 4.0–10.5)
nRBC: 0 % (ref 0.0–0.2)

## 2020-09-22 NOTE — ED Triage Notes (Signed)
Pt states he has SOB for almost 3 weeks, no able to get his CPAP at home, having nausea and dizziness, pt states he is not out of his house for the past 4 weeks and he tested negative for covid.

## 2020-09-22 NOTE — ED Notes (Signed)
Pt states he feels ok so he is just going to go home and will try urgent care in the morning

## 2020-09-24 ENCOUNTER — Ambulatory Visit
Admission: EM | Admit: 2020-09-24 | Discharge: 2020-09-24 | Disposition: A | Discharge status: Home / Self Care | Payer: Commercial Managed Care - PPO | Attending: Emergency Medicine | Admitting: Emergency Medicine

## 2020-09-24 ENCOUNTER — Encounter: Payer: Self-pay | Admitting: Emergency Medicine

## 2020-09-24 DIAGNOSIS — R110 Nausea: Secondary | ICD-10-CM

## 2020-09-24 DIAGNOSIS — R0602 Shortness of breath: Secondary | ICD-10-CM

## 2020-09-24 MED ORDER — DEXAMETHASONE 4 MG PO TABS: 4.0000 mg | ORAL_TABLET | Freq: Every day | ORAL | 0 refills | Status: AC

## 2020-09-24 MED ORDER — ONDANSETRON 4 MG PO TBDP: 4.0000 mg | ORAL_TABLET | Freq: Three times a day (TID) | ORAL | 0 refills | Status: AC | PRN

## 2020-09-24 NOTE — ED Provider Notes (Signed)
[facility] CARE CENTER   [id number] 09/24/20 Arrival Time: 1411   Chief Complaint  Patient presents with  . Shortness of Breath     SUBJECTIVE: History from: patient.  Jim Smith is a 54 y.o. male  who presented to the urgent care for complaint of nausea and  shortness of breath that has been ongoing for years.  Was seen in the ER on 09/22/2020 CBC, CMP EKG and chest x-ray completed.  Chest x-ray was negative.  Denies sick exposure to COVID, flu or strep.  He recently tested for COVID-19 and is awaiting the result.  Denies aggravating factors.  Report previous symptoms in the past.   Denies fever, chills, fatigue, sinus pain, rhinorrhea, sore throat, SOB, wheezing, chest pain, nausea, changes in bowel or bladder habits.     ROS: As per HPI.  All other pertinent ROS negative.     Past Medical History:  Diagnosis Date  . Acute ear infection   . Anxiety   . Bronchitis   . GERD (gastroesophageal reflux disease)   . H/O removal of neck cyst 01/2012   R anterior neck I&D, with abx  . Hyperlipidemia   . Migraine   . Sleep apnea    uses Bipap occaisionally   Past Surgical History:  Procedure Laterality Date  . FRACTURE SURGERY Left 2009   tibial fx  . PILONIDAL CYST EXCISION    . SUBMANDIBULAR GLAND EXCISION Right 05/17/2016   Procedure: INTRA ORAL EXCISION RIGHT SUBLINGUAL GLAND;  Surgeon: Serena Colonel, MD;  Location: [facility];  Service: ENT;  Laterality: Right;   Allergies  Allergen Reactions  . Morphine And Related Nausea And Vomiting  . Penicillins Rash   No current facility-administered medications on file prior to encounter.   Current Outpatient Medications on File Prior to Encounter  Medication Sig Dispense Refill  . amitriptyline (ELAVIL) 10 MG tablet Take 10 mg by mouth daily.    Marland Kitchen atorvastatin (LIPITOR) 10 MG tablet Take 10 mg by mouth daily.    . benzonatate (TESSALON) 100 MG capsule Take 1 capsule (100 mg total) by mouth every 8 (eight) hours as  needed for cough. 21 capsule 0  . buPROPion (WELLBUTRIN SR) 200 MG 12 hr tablet Take 200 mg by mouth 2 (two) times daily.    . clindamycin (CLEOCIN) 75 MG/5ML solution Take 20 mLs (300 mg total) by mouth 3 (three) times daily. 200 mL 0  . Dexlansoprazole (DEXILANT) 30 MG capsule Take 30 mg by mouth daily.    . fenofibrate 160 MG tablet Take 160 mg by mouth daily.    Marland Kitchen ibuprofen (ADVIL,MOTRIN) 200 MG tablet Take 200 mg by mouth every 4 (four) hours as needed (pain).    . nortriptyline (PAMELOR) 25 MG capsule Take 2 capsules (50 mg total) by mouth at bedtime. 60 capsule 11   Social History   Socioeconomic History  . Marital status: Married    Spouse name: Not on file  . Number of children: Not on file  . Years of education: Not on file  . Highest education level: Not on file  Occupational History  . Not on file  Tobacco Use  . Smoking status: Never Smoker  . Smokeless tobacco: Never Used  Substance and Sexual Activity  . Alcohol use: No  . Drug use: No  . Sexual activity: Not on file  Other Topics Concern  . Not on file  Social History Narrative   Lives home with wife, [name].  and daughter.  [employer],  Education BS [place].  One child.  Caffeine 2-3 drinks daily.     Social Determinants of Health   Financial Resource Strain:   . Difficulty of Paying Living Expenses: Not on file  Food Insecurity:   . Worried About Programme researcher, broadcasting/film/video in the Last Year: Not on file  . Ran Out of Food in the Last Year: Not on file  Transportation Needs:   . Lack of Transportation (Medical): Not on file  . Lack of Transportation (Non-Medical): Not on file  Physical Activity:   . Days of Exercise per Week: Not on file  . Minutes of Exercise per Session: Not on file  Stress:   . Feeling of Stress : Not on file  Social Connections:   . Frequency of Communication with Friends and Family: Not on file  . Frequency of Social Gatherings with Friends and Family: Not on file  . Attends Religious  Services: Not on file  . Active Member of Clubs or Organizations: Not on file  . Attends Banker Meetings: Not on file  . Marital Status: Not on file  Intimate Partner Violence:   . Fear of Current or Ex-Partner: Not on file  . Emotionally Abused: Not on file  . Physically Abused: Not on file  . Sexually Abused: Not on file   Family History  Problem Relation Age of Onset  . Parkinson's disease Mother   . Depression Father   . Liver cancer Maternal Grandfather     OBJECTIVE:  Vitals:   09/24/20 1422 09/24/20 1425  BP: 119/76   Pulse: 65   Resp: 19   Temp: 98.2 F (36.8 C)   TempSrc: Oral   SpO2: 97%   Weight:  205 lb 0.4 oz (93 kg)  Height:  6\' 2"  (1.88 m)     General appearance: alert; appears fatigued, but nontoxic; speaking in full sentences and tolerating own secretions HEENT: NCAT; Ears: EACs clear, TMs pearly gray; Eyes: PERRL.  EOM grossly intact. Sinuses: nontender; Nose: nares patent without rhinorrhea, Throat: oropharynx clear, tonsils non erythematous or enlarged, uvula midline  Neck: supple without LAD Lungs: unlabored respirations, symmetrical air entry; cough: absent; no respiratory distress; CTAB Heart: regular rate and rhythm.  Radial pulses 2+ symmetrical bilaterally Skin: warm and dry Psychological: alert and cooperative; normal mood and affect  LABS:  No results found for this or any previous visit (from the past 24 hour(s)).   ASSESSMENT & PLAN:  1. Shortness of breath   2. Nausea without vomiting     Meds ordered this encounter  Medications  . ondansetron (ZOFRAN ODT) 4 MG disintegrating tablet    Sig: Take 1 tablet (4 mg total) by mouth every 8 (eight) hours as needed for nausea or vomiting.    Dispense:  20 tablet    Refill:  0  . dexamethasone (DECADRON) 4 MG tablet    Sig: Take 1 tablet (4 mg total) by mouth daily for 7 days.    Dispense:  7 tablet    Refill:  0   Patient is stable at discharge.  He was seen at the ER  on 09/22/2020 and a chest x-ray was negative.  He reported multiple CT scan that was done previously that did not show any ongoing disease process.  He was advised to continue to use albuterol as prescribed.  To follow-up with pulmonologist.  Discharge instructions  Get plenty of rest and push fluids Decadron was prescribed take as directed  Zofran was prescribed for nausea.  Use it 30-minute before eating Continue to take albuterol as prescribed by PCP Follow-up with PCP/pulmonologist as SOB has been chronic Use medications daily for symptom relief Use OTC medications like ibuprofen or tylenol as needed fever or pain Call or go to the ED if you have any new or worsening symptoms such as fever, worsening cough, shortness of breath, chest tightness, chest pain, turning blue, changes in mental status, etc...   Reviewed expectations re: course of current medical issues. Questions answered. Outlined signs and symptoms indicating need for more acute intervention. Patient verbalized understanding. After Visit Summary given.         Durward Parcel, FNP 09/24/20 1517

## 2020-09-24 NOTE — ED Triage Notes (Addendum)
Pt states he has chronic shortness of breath that has been ongoing for years.  States for the past few weeks he has had an extreme amount of  fatigue for past few weeks.  States he is unable to sleep at night.  Had send out covid test yesterday, has not gotten the results.

## 2020-09-24 NOTE — Discharge Instructions (Addendum)
Get plenty of rest and push fluids Decadron was prescribed take as directed  Zofran was prescribed for nausea.  Use it 30-minute before eating Continue to take albuterol as prescribed by PCP Follow-up with PCP/pulmonologist as SOB has been chronic Use medications daily for symptom relief Use OTC medications like ibuprofen or tylenol as needed fever or pain Call or go to the ED if you have any new or worsening symptoms such as fever, worsening cough, shortness of breath, chest tightness, chest pain, turning blue, changes in mental status, etc..Marland Kitchen

## 2020-10-13 ENCOUNTER — Other Ambulatory Visit: Payer: Self-pay | Admitting: Otolaryngology

## 2020-10-23 ENCOUNTER — Other Ambulatory Visit (HOSPITAL_COMMUNITY): Payer: Commercial Managed Care - PPO | Attending: Psychiatry | Admitting: Licensed Clinical Social Worker

## 2020-10-23 ENCOUNTER — Other Ambulatory Visit: Payer: Self-pay

## 2020-10-23 DIAGNOSIS — F332 Major depressive disorder, recurrent severe without psychotic features: Secondary | ICD-10-CM | POA: Insufficient documentation

## 2020-10-23 DIAGNOSIS — Z566 Other physical and mental strain related to work: Secondary | ICD-10-CM | POA: Insufficient documentation

## 2020-10-23 DIAGNOSIS — F411 Generalized anxiety disorder: Secondary | ICD-10-CM | POA: Diagnosis not present

## 2020-10-24 ENCOUNTER — Encounter (HOSPITAL_COMMUNITY): Payer: Self-pay

## 2020-10-24 ENCOUNTER — Other Ambulatory Visit (HOSPITAL_COMMUNITY): Payer: Commercial Managed Care - PPO | Admitting: Licensed Clinical Social Worker

## 2020-10-24 ENCOUNTER — Other Ambulatory Visit (HOSPITAL_COMMUNITY): Payer: Commercial Managed Care - PPO | Admitting: Occupational Therapy

## 2020-10-24 ENCOUNTER — Other Ambulatory Visit: Payer: Self-pay

## 2020-10-24 DIAGNOSIS — F332 Major depressive disorder, recurrent severe without psychotic features: Secondary | ICD-10-CM | POA: Diagnosis not present

## 2020-10-24 DIAGNOSIS — R4589 Other symptoms and signs involving emotional state: Secondary | ICD-10-CM

## 2020-10-24 DIAGNOSIS — F411 Generalized anxiety disorder: Secondary | ICD-10-CM

## 2020-10-24 DIAGNOSIS — R41844 Frontal lobe and executive function deficit: Secondary | ICD-10-CM

## 2020-10-24 NOTE — Psych (Signed)
Virtual Visit via Video Note  I connected with Jim Smith on 10/24/20 at  9:00 AM EDT by a video enabled telemedicine application and verified that I am speaking with the correct person using two identifiers.  Location: Patient: Patient Home Provider: Clinical Home Office   I discussed the limitations of evaluation and management by telemedicine and the availability of in person appointments. The patient expressed understanding and agreed to proceed.   I discussed the assessment and treatment plan with the patient. The patient was provided an opportunity to ask questions and all were answered. The patient agreed with the plan and demonstrated an understanding of the instructions.   The patient was advised to call back or seek an in-person evaluation if the symptoms worsen or if the condition fails to improve as anticipated.  Cln and pt completed treatment plan and pt verbally reports alignment. Pt verbally consents to treatment.   I provided 10 minutes of non-face-to-face time during this encounter.   Donia Guiles, LCSW
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
## 2020-10-24 NOTE — Progress Notes (Signed)
Virtual Visit via Video Note  I connected with Jim Smith on 10/24/20 at  9:00 AM EDT by a video enabled telemedicine application and verified that I am speaking with the correct person using two identifiers.  Location: Patient: Home  Provider: Home   I discussed the limitations of evaluation and management by telemedicine and the availability of in person appointments. The patient expressed understanding and agreed to proceed.    I discussed the assessment and treatment plan with the patient. The patient was provided an opportunity to ask questions and all were answered. The patient agreed with the plan and demonstrated an understanding of the instructions.   The patient was advised to call back or seek an in-person evaluation if the symptoms worsen or if the condition fails to improve as anticipated.  I provided 15 minutes of non-face-to-face time during this encounter.   Oneta Rack, NP    Behavioral Health Partial Program Assessment Note  Date: 10/24/2020 Name: Jim Smith MRN: [id number]    HPI: Patient is a 54 y.o. Caucasian male presents with depression and anxiety mainly due to work related stressors.  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  Reports he has been employed there for the past 21 years. Stated that he has been unable to let situation go states he ruminates on problems constantly.  States this is starting to affect his marriage as he just comes home to "rant" about workplace stressors.  Patient reports he feels frustrated and continues to do well on every day situation.  Patient states " I do not like feeling responsible for others."  Reported history with depression and anxiety.  States he is prescribed Wellbutrin and was recently initiated on Zoloft.  He denied suicidal or homicidal ideations.  Denies auditory or visual hallucinations.  States his wife has been supportive.  Denied any illicit substance abuse or drug use.  Reported strained relationship between he and his father which is added to his daily stressors.  Reports his relationship between he and his mother is great.  Patient was enrolled in partial psychiatric program on 10/24/20  Reported family history of mental illness.  Grandfather and great grandfather committed suicide.  Father struggles with substance abuse/alcohol.  Jim Smith reports a decreased appetite, poor sleep, difficulty concentrating and feelings of worthlessness.Denied previous inpatient admissions.  Denied previous suicide attempts denied history of physical or sexual abuse.  Primary complaints include: depression worse, feeling depressed, poor concentration and relationship difficulties.  Onset of symptoms was gradual with gradually worsening course since that time. Psychosocial Stressors include the following: occupational.   I have reviewed the following documentation dated 10/24/2020: past psychiatric history, past medical history and past social and family history  Complaints of Pain: nonear Past Psychiatric History:  None  Currently in treatment with Wellbutrin and Zoloft   Substance Abuse History: none Use of Alcohol: denied Use of Caffeine: denies use Use of over the counter:   Past Surgical History:  Procedure Laterality Date   FRACTURE SURGERY Left 2009   tibial fx   PILONIDAL CYST EXCISION     SUBMANDIBULAR GLAND EXCISION Right 05/17/2016   Procedure: INTRA ORAL EXCISION RIGHT SUBLINGUAL GLAND;  Surgeon: Serena Colonel, MD;  Location: [facility];  Service: ENT;  Laterality: Right;    Past Medical History:  Diagnosis Date   Acute ear infection    Anxiety    Bronchitis    GERD (gastroesophageal reflux disease)    H/O  removal of neck cyst 01/2012   R anterior neck I&D, with abx   Hyperlipidemia    Migraine    Sleep apnea    uses Bipap occaisionally   Outpatient Encounter Medications as of 10/24/2020  Medication Sig   buPROPion  (WELLBUTRIN SR) 200 MG 12 hr tablet Take 200 mg by mouth 2 (two) times daily.   cyclobenzaprine (FLEXERIL) 10 MG tablet Take 10 mg by mouth 3 (three) times daily as needed for muscle spasms.   montelukast (SINGULAIR) 10 MG tablet Take 10 mg by mouth every morning.   ondansetron (ZOFRAN ODT) 4 MG disintegrating tablet Take 1 tablet (4 mg total) by mouth every 8 (eight) hours as needed for nausea or vomiting.   pantoprazole (PROTONIX) 40 MG tablet Take 40 mg by mouth 2 (two) times daily.   propranolol (INDERAL) 80 MG tablet Take 80 mg by mouth every morning.   sertraline (ZOLOFT) 50 MG tablet Take 50 mg by mouth daily.   amitriptyline (ELAVIL) 10 MG tablet Take 10 mg by mouth daily. (Patient not taking: Reported on 10/24/2020)   atorvastatin (LIPITOR) 10 MG tablet Take 10 mg by mouth daily. (Patient not taking: Reported on 10/24/2020)   benzonatate (TESSALON) 100 MG capsule Take 1 capsule (100 mg total) by mouth every 8 (eight) hours as needed for cough. (Patient not taking: Reported on 10/24/2020)   clindamycin (CLEOCIN) 75 MG/5ML solution Take 20 mLs (300 mg total) by mouth 3 (three) times daily. (Patient not taking: Reported on 10/24/2020)   Dexlansoprazole (DEXILANT) 30 MG capsule Take 30 mg by mouth daily. (Patient not taking: Reported on 10/24/2020)   fenofibrate 160 MG tablet Take 160 mg by mouth daily. (Patient not taking: Reported on 10/24/2020)   ibuprofen (ADVIL,MOTRIN) 200 MG tablet Take 200 mg by mouth every 4 (four) hours as needed (pain). (Patient not taking: Reported on 10/24/2020)   nortriptyline (PAMELOR) 25 MG capsule Take 2 capsules (50 mg total) by mouth at bedtime. (Patient not taking: Reported on 10/24/2020)   No facility-administered encounter medications on file as of 10/24/2020.   Allergies  Allergen Reactions   Morphine And Related Nausea And Vomiting   Penicillins Rash    Social History   Tobacco Use   Smoking status: Never Smoker   Smokeless  tobacco: Never Used  Substance Use Topics   Alcohol use: No   Functioning Relationships: good support system and good relationship with spouse or significant other Education: College       Please specify degree:  Other Pertinent History: None Family History  Problem Relation Age of Onset   Parkinson's disease Mother    Depression Father    Liver cancer Maternal Grandfather      Review of Systems Constitutional: negative  Objective:  There were no vitals filed for this visit.  Physical Exam:   Mental Status Exam: Appearance:  Well groomed Psychomotor::  Within Normal Limits Attention span and concentration: Normal Behavior: calm, cooperative and adequate rapport can be established Speech:  normal pitch and normal volume Mood:  depressed Affect:  normal Thought Process:  Coherent Thought Content:  Hallucinations: None Orientation:  person, place and time/date Cognition:  grossly intact Insight:  Intact Judgment:  Intact Estimate of Intelligence: Average Fund of knowledge: Aware of current events Memory: Recent and remote intact Abnormal movements: None Gait and station: Normal  Assessment:  Diagnosis: Severe episode of recurrent major depressive disorder, without psychotic features (HCC) [F33.2] 1. Severe episode of recurrent major depressive disorder, without psychotic features (  HCC)   2. GAD (generalized anxiety disorder)     Indications for admission: inpatient care required if not in partial hospital program  Plan: Orders placed for occupational therapy patient enrolled in Partial Hospitalization Program, patient's current medications are to be continued, a comprehensive treatment plan will be developed and side effects of medications have been reviewed with patient  Treatment options and alternatives reviewed with patient and patient understands the above plan. Treatment plan was reviewed and agreed upon by NP T.Melvyn Neth and patient Jim Smith need for  group services.    Oneta Rack, NP

## 2020-10-25 ENCOUNTER — Other Ambulatory Visit (HOSPITAL_COMMUNITY): Payer: Commercial Managed Care - PPO | Admitting: Licensed Clinical Social Worker

## 2020-10-25 ENCOUNTER — Other Ambulatory Visit: Payer: Self-pay

## 2020-10-25 ENCOUNTER — Other Ambulatory Visit (HOSPITAL_COMMUNITY): Payer: Commercial Managed Care - PPO | Admitting: Occupational Therapy

## 2020-10-25 ENCOUNTER — Encounter (HOSPITAL_BASED_OUTPATIENT_CLINIC_OR_DEPARTMENT_OTHER): Payer: Self-pay | Admitting: Otolaryngology

## 2020-10-25 ENCOUNTER — Encounter (HOSPITAL_COMMUNITY): Payer: Self-pay

## 2020-10-25 DIAGNOSIS — F332 Major depressive disorder, recurrent severe without psychotic features: Secondary | ICD-10-CM | POA: Diagnosis not present

## 2020-10-25 DIAGNOSIS — F411 Generalized anxiety disorder: Secondary | ICD-10-CM

## 2020-10-25 DIAGNOSIS — R41844 Frontal lobe and executive function deficit: Secondary | ICD-10-CM

## 2020-10-25 DIAGNOSIS — R4589 Other symptoms and signs involving emotional state: Secondary | ICD-10-CM

## 2020-10-25 NOTE — Therapy (Signed)
[facility] PARTIAL HOSPITALIZATION PROGRAM [address] [place], [place], [address] Phone: [phone]   Fax:  [phone] Virtual Visit via Video Note  I connected with Jim Smith on 10/25/20 at  12:00 PM EDT by a video enabled telemedicine application and verified that I am speaking with the correct person using two identifiers.  Location: Patient: Patient Home Provider: Clinic Office   I discussed the limitations of evaluation and management by telemedicine and the availability of in person appointments. The patient expressed understanding and agreed to proceed.    I discussed the assessment and treatment plan with the patient. The patient was provided an opportunity to ask questions and all were answered. The patient agreed with the plan and demonstrated an understanding of the instructions.   The patient was advised to call back or seek an in-person evaluation if the symptoms worsen or if the condition fails to improve as anticipated.  I provided 50 minutes of non-face-to-face time during this encounter.   Donne Hazel, OT   Occupational Therapy Treatment  Patient Details  Name: Jim Smith MRN: [id number] Date of Birth: 20-Feb-1966 Referring Provider (OT): Hillery Jacks   Encounter Date: 10/25/2020   OT End of Session - 10/25/20 1434    Visit Number 2    Number of Visits 20    Date for OT Re-Evaluation 11/21/20    Authorization Type [employer]    OT Start Time 1200    OT Stop Time 1250    OT Time Calculation (min) 50 min    Activity Tolerance Patient tolerated treatment well    Behavior During Therapy Flat affect;WFL for tasks assessed/performed           Past Medical History:  Diagnosis Date  . Acute ear infection   . Anxiety   . Bronchitis   . GERD (gastroesophageal reflux disease)   . H/O removal of neck cyst 01/2012   R anterior neck I&D, with abx  . Hyperlipidemia   . Migraine   . Sleep apnea    uses Bipap  occaisionally    Past Surgical History:  Procedure Laterality Date  . FRACTURE SURGERY Left 2009   tibial fx  . PILONIDAL CYST EXCISION    . SUBMANDIBULAR GLAND EXCISION Right 05/17/2016   Procedure: INTRA ORAL EXCISION RIGHT SUBLINGUAL GLAND;  Surgeon: Serena Colonel, MD;  Location: [facility];  Service: ENT;  Laterality: Right;    There were no vitals filed for this visit.   Subjective Assessment - 10/25/20 1433    Currently in Pain? No/denies                                OT Education - 10/25/20 1433    Education Details Educated on concept of sensory modulation and self-soothing as coping strategies through use of the five senses    Person(s) Educated Patient    Methods Explanation;Handout    Comprehension Verbalized understanding            OT Short Term Goals - 10/25/20 1435      OT SHORT TERM GOAL #1   Title Pt will actively engage in OT group sessions throughout duration of PHP programming, in order to promote daily structure, social engagement, and opportunities to develop and utilize adaptive strategies to maximize functional performance in preparation for safe transition and integration back into school, work, and the community.    Status On-going  OT SHORT TERM GOAL #2   Title Pt will practice and identify 1-3 adaptive coping strategies he can utilize, in order to safely manage increased depression/anxiety, with min cues, in preparation for safe and healthy reintegration back into the community at discharge.    Status On-going      OT SHORT TERM GOAL #3   Title Pt will demonstrate improved assertive communication, as evidenced by, active participation in OT sessions, throughout duration of PHP programming, in order to safely transition back into the community at discharge.    Status On-going      OT SHORT TERM GOAL #4   Title Pt will identify and implement 1-3 sleep hygiene strategies he can utilize, in order to improve  sleep quality/ADL performance, in preparation for safe and healthy reintegration back into the community at discharge.    Status On-going         Group Session:  S: "Being in quiet is under-rated. I like to be in my car, quiet, without any music on and going for a long drive."  O: Today's group session continued with focus/discussion on topic of sensory modulation and self-soothing through use of the 8 senses. Discussion reviewed the concept of sensory modulation and integration, focusing on how we can utilize our body and it's senses to self-soothe or cope, when we are experiencing an over or under-whelming sensation or feeling. Group members engaged actively in discussion sharing their own personal sensory likes/dislikes as it related to the remaining 5 senses to discuss.   A: Jim Smith was active in his participation of discussion and activity, appeared more engaged and focus than yesterday's session. Pt expressed that he "never" talks about his emotions, feelings, and what is going on with him and felt a sense of connection to other group members. Pt shared appreciation for the suggestions and strategies provided to him by other group members. He identified several sensory self-soothing strategies that he finds relaxing, including going for a long car drive without any music on and smelling freshly cut grass. Pt identified chewing on ice as something he does not like, along with loud noises and distractions.   P: Continue to attend PHP OT group sessions 5x week for 3 weeks to promote daily structure, social engagement, and opportunities to develop and utilize adaptive strategies to maximize functional performance in preparation for safe transition and integration back into school, work, and the community. Plan to address topic of health and wellness in next OT group session.    Plan - 10/25/20 1434    Occupational performance deficits (Please refer to evaluation for details): ADL's;IADL's;Rest and  Sleep;Education;Work;Leisure;Social Participation    Body Structure / Function / Physical Skills ADL    Cognitive Skills Attention;Consciousness;Emotional;Energy/Drive;Memory;Safety Awareness;Problem Solve;Temperament/Personality;Thought;Understand    Psychosocial Skills Coping Strategies;Environmental  Adaptations;Habits;Interpersonal Interaction;Routines and Behaviors           Patient will benefit from skilled therapeutic intervention in order to improve the following deficits and impairments:   Body Structure / Function / Physical Skills: ADL Cognitive Skills: Attention, Consciousness, Emotional, Energy/Drive, Memory, Safety Awareness, Problem Solve, Temperament/Personality, Thought, Understand Psychosocial Skills: Coping Strategies, Environmental  Adaptations, Habits, Interpersonal Interaction, Routines and Behaviors   Visit Diagnosis: Difficulty coping  Frontal lobe and executive function deficit  Severe episode of recurrent major depressive disorder, without psychotic features (HCC)    Problem List Patient Active Problem List   Diagnosis Date Noted  . Migraine 08/04/2017  . Sleep apnea 08/04/2017  . Anxiety 08/04/2017  . 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 [place], [place], [address] Phone: [phone]   Fax:  [phone]  Name: Jim Smith MRN: [id number] Date of Birth: 1966/07/29

## 2020-10-26 ENCOUNTER — Encounter (HOSPITAL_COMMUNITY): Payer: Self-pay

## 2020-10-26 ENCOUNTER — Other Ambulatory Visit (HOSPITAL_COMMUNITY): Payer: Commercial Managed Care - PPO | Admitting: Licensed Clinical Social Worker

## 2020-10-26 ENCOUNTER — Other Ambulatory Visit (HOSPITAL_COMMUNITY): Payer: Commercial Managed Care - PPO | Admitting: Occupational Therapy

## 2020-10-26 DIAGNOSIS — R4589 Other symptoms and signs involving emotional state: Secondary | ICD-10-CM

## 2020-10-26 DIAGNOSIS — R41844 Frontal lobe and executive function deficit: Secondary | ICD-10-CM

## 2020-10-26 DIAGNOSIS — F411 Generalized anxiety disorder: Secondary | ICD-10-CM

## 2020-10-26 DIAGNOSIS — F332 Major depressive disorder, recurrent severe without psychotic features: Secondary | ICD-10-CM

## 2020-10-26 NOTE — Therapy (Signed)
[place], [place], [address] Phone: [phone]   Fax:  [phone] Virtual Visit via Video Note  I connected with [name] on 10/26/20 at  12:00 PM EDT by a video enabled telemedicine application and verified that I am speaking with the correct person using two identifiers.  Location: Patient: Patient Home Provider: Clinic Office   I discussed the limitations of evaluation and management by telemedicine and the availability of in person appointments. The patient expressed understanding and agreed to proceed.  I discussed the assessment and treatment plan with the patient. The patient was provided an opportunity to ask questions and all were answered. The patient agreed with the plan and demonstrated an understanding of the instructions.   The patient was advised to call back or seek an in-person evaluation if the symptoms worsen or if the condition fails to improve as anticipated.  I provided 55 minutes of non-face-to-face time during this encounter.   Ponciano Ort, OT   Occupational Therapy Treatment  Patient Details  Name: Van Seymore MRN: [id number] Date of Birth: 1966/06/27 Referring Provider (OT): Ricky Ala   Encounter Date: 10/26/2020   OT End of Session - 10/26/20 1443    Visit Number 3    Number of Visits 20    Date for OT Re-Evaluation 11/21/20    Authorization Type [employer] Deduct 3000-met No Copay Co Ins 10% 25.00 due toward until OOP is met Visit limit 60- based on medical necessity combined with PT/OT/ST No auth required -once goals are met there is no coverage for Maintenance OT    Authorization - Visit Number 3    Authorization - Number of Visits 60    OT Start Time 1200    OT Stop Time 1255    OT Time Calculation (min) 55 min    Activity Tolerance Patient tolerated treatment well    Behavior During Therapy WFL for tasks assessed/performed            Past Medical History:  Diagnosis Date  . Acute ear infection   . Anxiety   . Bronchitis   . GERD (gastroesophageal reflux disease)   . H/O removal of neck cyst 01/2012   R anterior neck I&D, with abx  . Hyperlipidemia   . Hypertension   . Migraine   . Sleep apnea    uses Bipap occaisionally    Past Surgical History:  Procedure Laterality Date  . FRACTURE SURGERY Left 2009   tibial fx  . PILONIDAL CYST EXCISION    . SUBMANDIBULAR GLAND EXCISION Right 05/17/2016   Procedure: INTRA ORAL EXCISION RIGHT SUBLINGUAL GLAND;  Surgeon: Izora Gala, MD;  Location: [facility];  Service: ENT;  Laterality: Right;    There were no vitals filed for this visit.   Subjective Assessment - 10/26/20 1443    Currently in Pain? No/denies                                OT Education - 10/26/20 1443    Education Details Educated on the 5 F's and provided resources/strategies/tips to improve overall health and wellness    Person(s) Educated Patient    Methods Explanation;Handout    Comprehension Verbalized understanding            OT Short Term Goals - 10/25/20 1435      OT SHORT TERM GOAL #1  Title Pt will actively engage in OT group sessions throughout duration of PHP programming, in order to promote daily structure, social engagement, and opportunities to develop and utilize adaptive strategies to maximize functional performance in preparation for safe transition and integration back into school, work, and the community.    Status On-going      OT SHORT TERM GOAL #2   Title Pt will practice and identify 1-3 adaptive coping strategies he can utilize, in order to safely manage increased depression/anxiety, with min cues, in preparation for safe and healthy reintegration back into the community at discharge.    Status On-going      OT SHORT TERM GOAL #3   Title Pt will demonstrate improved assertive communication, as evidenced by, active  participation in OT sessions, throughout duration of PHP programming, in order to safely transition back into the community at discharge.    Status On-going      OT SHORT TERM GOAL #4   Title Pt will identify and implement 1-3 sleep hygiene strategies he can utilize, in order to improve sleep quality/ADL performance, in preparation for safe and healthy reintegration back into the community at discharge.    Status On-going         Group Session:  S: "I don't know if this is related, but I took the advice of other members and went out yesterday and bought a guitar."  O: Today's group session focused on the topic of health and wellness as it relates to the impact on mental health. Discussion focused on identifying the 5 F's to wellness including Food, Fitness, Fresh air, Fellowship, and Friendship with self and soul. Group members identified areas of wellness that they would like to improve upon and were educated and offered various resources. Discussion also focused on how the food we eat impacts our mental health, along with the benefits of engaging in physical activity/exercise and getting outside for fresh air. Discussion wrapped up with group members identifying one area of wellness they could improve upon and identified a strategy to do so.    A: [name] was active and independent in his participation of discussion, though at times, was focused on the needs of his daughter and less on his own difficulties. Pt expressed interest in looking at home meal plans similar to Home Fresh and Freshly to work on his nutrition goals. He also shared that he is currently in PT for a herniated disc and has a lot of residual pain as a result, though was receptive to learning additional strategies to incorporate fitness and exercise into his daily routine. He shared a long term goal of wanting to lift enough weights to engage in yard work and manage his grass.   P: Continue to attend PHP OT group sessions 5x week for  2 weeks to promote daily structure, social engagement, and opportunities to develop and utilize adaptive strategies to maximize functional performance in preparation for safe transition and integration back into school, work, and the community. Plan to address topic of sleep hygiene in next OT group session.   Plan - 10/26/20 1447    Occupational performance deficits (Please refer to evaluation for details): ADL's;IADL's;Rest and Sleep;Education;Work;Leisure;Social Participation    Body Structure / Function / Physical Skills ADL    Cognitive Skills Attention;Consciousness;Emotional;Energy/Drive;Memory;Safety Awareness;Problem Solve;Temperament/Personality;Thought;Understand    Psychosocial Skills Coping Strategies;Environmental  Adaptations;Habits;Interpersonal Interaction;Routines and Behaviors           Patient will benefit from skilled therapeutic intervention in order to improve the  following deficits and impairments:   Body Structure / Function / Physical Skills: ADL Cognitive Skills: Attention, Consciousness, Emotional, Energy/Drive, Memory, Safety Awareness, Problem Solve, Temperament/Personality, Thought, Understand Psychosocial Skills: Coping Strategies, Environmental  Adaptations, Habits, Interpersonal Interaction, Routines and Behaviors   Visit Diagnosis: Difficulty coping  Frontal lobe and executive function deficit  Severe episode of recurrent major depressive disorder, without psychotic features [facility])    Problem List Patient Active Problem List   Diagnosis Date Noted  . Migraine 08/04/2017  . Sleep apnea 08/04/2017  . Anxiety 08/04/2017  . Ranula of floor of mouth 05/19/2016    10/26/2020  Ponciano Ort, MOT, OTR/L  10/26/2020, 2:48 PM  [facility] HOSPITALIZATION PROGRAM [place], [place], [address] Phone: [phone]   Fax:  [phone]  Name: Adain Geurin MRN: [id number] Date of Birth: 1966/06/24

## 2020-10-27 ENCOUNTER — Other Ambulatory Visit (HOSPITAL_COMMUNITY): Payer: Commercial Managed Care - PPO | Admitting: Licensed Clinical Social Worker

## 2020-10-27 ENCOUNTER — Encounter (HOSPITAL_COMMUNITY): Payer: Self-pay

## 2020-10-27 ENCOUNTER — Other Ambulatory Visit: Payer: Self-pay

## 2020-10-27 ENCOUNTER — Other Ambulatory Visit (HOSPITAL_COMMUNITY): Payer: Commercial Managed Care - PPO | Admitting: Occupational Therapy

## 2020-10-27 DIAGNOSIS — F411 Generalized anxiety disorder: Secondary | ICD-10-CM

## 2020-10-27 DIAGNOSIS — F332 Major depressive disorder, recurrent severe without psychotic features: Secondary | ICD-10-CM | POA: Diagnosis not present

## 2020-10-27 DIAGNOSIS — R41844 Frontal lobe and executive function deficit: Secondary | ICD-10-CM

## 2020-10-27 DIAGNOSIS — R4589 Other symptoms and signs involving emotional state: Secondary | ICD-10-CM

## 2020-10-27 NOTE — Therapy (Signed)
[place], [place], [address] Phone: [phone]   Fax:  [phone] Virtual Visit via Video Note  I connected with Jim Smith on 10/27/20 at  11:00 AM EDT by a video enabled telemedicine application and verified that I am speaking with the correct person using two identifiers.  Location: Patient: Patient Home Provider: Clinic Office   I discussed the limitations of evaluation and management by telemedicine and the availability of in person appointments. The patient expressed understanding and agreed to proceed.  I discussed the assessment and treatment plan with the patient. The patient was provided an opportunity to ask questions and all were answered. The patient agreed with the plan and demonstrated an understanding of the instructions.   The patient was advised to call back or seek an in-person evaluation if the symptoms worsen or if the condition fails to improve as anticipated.  I provided 65 minutes of non-face-to-face time during this encounter.   Jim Smith, OT   Occupational Therapy Treatment  Patient Details  Name: Jim Smith MRN: [id number] Date of Birth: December 22, 1966 Referring Provider (OT): Jim Smith   Encounter Date: 10/27/2020   OT End of Session - 10/27/20 1423    Visit Number 4    Number of Visits 20    Date for OT Re-Evaluation 11/21/20    Authorization Type [employer] Deduct 3000-met No Copay Co Ins 10% 25.00 due toward until OOP is met Visit limit 60- based on medical necessity combined with PT/OT/ST No auth required -once goals are met there is no coverage for Maintenance OT    Authorization - Visit Number 4    Authorization - Number of Visits 60    OT Start Time 1105    OT Stop Time 1210    OT Time Calculation (min) 65 min    Activity Tolerance Patient tolerated treatment well    Behavior During Therapy WFL for tasks assessed/performed            Past Medical History:  Diagnosis Date  . Acute ear infection   . Anxiety   . Bronchitis   . GERD (gastroesophageal reflux disease)   . H/O removal of neck cyst 01/2012   R anterior neck I&D, with abx  . Hyperlipidemia   . Hypertension   . Migraine   . Sleep apnea    uses Bipap occaisionally    Past Surgical History:  Procedure Laterality Date  . FRACTURE SURGERY Left 2009   tibial fx  . PILONIDAL CYST EXCISION    . SUBMANDIBULAR GLAND EXCISION Right 05/17/2016   Procedure: INTRA ORAL EXCISION RIGHT SUBLINGUAL GLAND;  Surgeon: Jim Gala, MD;  Location: [facility];  Service: ENT;  Laterality: Right;    There were no vitals filed for this visit.   Subjective Assessment - 10/27/20 1423    Currently in Pain? No/denies                                OT Education - 10/27/20 1423    Education Details Educated on sleep hygiene and strategies to improve overall sleep quality    Person(s) Educated Patient    Methods Explanation;Handout    Comprehension Verbalized understanding            OT Short Term Goals - 10/25/20 1435      OT SHORT TERM GOAL #1   Title Pt  will actively engage in OT group sessions throughout duration of PHP programming, in order to promote daily structure, social engagement, and opportunities to develop and utilize adaptive strategies to maximize functional performance in preparation for safe transition and integration back into school, work, and the community.    Status On-going      OT SHORT TERM GOAL #2   Title Pt will practice and identify 1-3 adaptive coping strategies he can utilize, in order to safely manage increased depression/anxiety, with min cues, in preparation for safe and healthy reintegration back into the community at discharge.    Status On-going      OT SHORT TERM GOAL #3   Title Pt will demonstrate improved assertive communication, as evidenced by, active participation in OT sessions,  throughout duration of PHP programming, in order to safely transition back into the community at discharge.    Status On-going      OT SHORT TERM GOAL #4   Title Pt will identify and implement 1-3 sleep hygiene strategies he can utilize, in order to improve sleep quality/ADL performance, in preparation for safe and healthy reintegration back into the community at discharge.    Status On-going         Group Session:  S: "I have sleep apnea and use a CPAP machine. The topic of sleep is an absolute nightmare for me. I only ever get about 5.5 hours each night."  O: Today's group discussion focused on topic of Sleep Hygiene. Patients reflected on the quality of sleep they typically receive and identified areas that need improvement. Group was given background information on sleep and sleep hygiene, including common sleep disorders. Group members also received information on how to improve one's sleep and introduced a sleep diary as a tool that can be utilized to track sleep quality over a length of time. Group session ended with patients identifying one or more strategies they could utilize or implement into their sleep routine in order to improve overall sleep quality.    A: Jim Smith was active in his participation of discussion, sharing that he suffers from sleep apnea and has "always" had difficulty with his sleep. Pt shared several strategies that have worked for him in the past and often agreed with suggestions and strategies offered by group leader. Pt identified medication changes and consuming dairy as two specific ways in which his sleep has been impacted negatively and now knows to adjust and avoid those before bedtime. Appeared to know a lot about the content and appeared engaged and active in education and discussion.   P: Continue to attend PHP OT group sessions 5x week for 2 weeks to promote daily structure, social engagement, and opportunities to develop and utilize adaptive strategies to  maximize functional performance in preparation for safe transition and integration back into school, work, and the community. Plan to address topic of circle of control in next OT group session.    Plan - 10/27/20 1423    Occupational performance deficits (Please refer to evaluation for details): ADL's;IADL's;Rest and Sleep;Education;Work;Leisure;Social Participation    Body Structure / Function / Physical Skills ADL    Cognitive Skills Attention;Consciousness;Emotional;Energy/Drive;Memory;Safety Awareness;Problem Solve;Temperament/Personality;Thought;Understand    Psychosocial Skills Coping Strategies;Environmental  Adaptations;Habits;Interpersonal Interaction;Routines and Behaviors           Patient will benefit from skilled therapeutic intervention in order to improve the following deficits and impairments:   Body Structure / Function / Physical Skills: ADL Cognitive Skills: Attention, Consciousness, Emotional, Energy/Drive, Memory, Safety Awareness, Problem Solve,  Temperament/Personality, Thought, Understand Psychosocial Skills: Coping Strategies, Environmental  Adaptations, Habits, Interpersonal Interaction, Routines and Behaviors   Visit Diagnosis: Difficulty coping  Frontal lobe and executive function deficit  Severe episode of recurrent major depressive disorder, without psychotic features [facility])    Problem List Patient Active Problem List   Diagnosis Date Noted  . Migraine 08/04/2017  . Sleep apnea 08/04/2017  . Anxiety 08/04/2017  . Ranula of floor of mouth 05/19/2016    10/27/2020  Jim Smith, MOT, OTR/L  10/27/2020, 2:24 PM  [facility] HOSPITALIZATION PROGRAM [place], [place], [address] Phone: [phone]   Fax:  [phone]  Name: Mak Bonny MRN: [id number] Date of Birth: September 18, 1966

## 2020-10-28 ENCOUNTER — Inpatient Hospital Stay (HOSPITAL_COMMUNITY): Admission: RE | Admit: 2020-10-28 | Payer: Commercial Managed Care - PPO | Source: Ambulatory Visit

## 2020-10-30 ENCOUNTER — Other Ambulatory Visit (HOSPITAL_COMMUNITY): Payer: Commercial Managed Care - PPO | Admitting: Licensed Clinical Social Worker

## 2020-10-30 ENCOUNTER — Encounter (HOSPITAL_COMMUNITY): Payer: Self-pay

## 2020-10-30 ENCOUNTER — Other Ambulatory Visit: Payer: Self-pay

## 2020-10-30 ENCOUNTER — Other Ambulatory Visit (HOSPITAL_COMMUNITY): Payer: Commercial Managed Care - PPO | Attending: Psychiatry | Admitting: Occupational Therapy

## 2020-10-30 ENCOUNTER — Other Ambulatory Visit (HOSPITAL_COMMUNITY)
Admission: RE | Admit: 2020-10-30 | Discharge: 2020-10-30 | Disposition: A | Discharge status: Home / Self Care | Payer: Commercial Managed Care - PPO | Source: Ambulatory Visit | Attending: Otolaryngology | Admitting: Otolaryngology

## 2020-10-30 DIAGNOSIS — F411 Generalized anxiety disorder: Secondary | ICD-10-CM

## 2020-10-30 DIAGNOSIS — Z20822 Contact with and (suspected) exposure to covid-19: Secondary | ICD-10-CM | POA: Diagnosis not present

## 2020-10-30 DIAGNOSIS — F332 Major depressive disorder, recurrent severe without psychotic features: Secondary | ICD-10-CM

## 2020-10-30 DIAGNOSIS — G4733 Obstructive sleep apnea (adult) (pediatric): Secondary | ICD-10-CM | POA: Diagnosis not present

## 2020-10-30 DIAGNOSIS — R41844 Frontal lobe and executive function deficit: Secondary | ICD-10-CM

## 2020-10-30 DIAGNOSIS — R4589 Other symptoms and signs involving emotional state: Secondary | ICD-10-CM

## 2020-10-30 NOTE — Therapy (Signed)
[name], [place], [address] Phone: [phone]   Fax:  [phone] Virtual Visit via Video Note  I connected with Jim Smith on 10/30/20 at 11:00 AM EDT by a video enabled telemedicine application and verified that I am speaking with the correct person using two identifiers.  Location: Patient: Patient Home Provider: Clinic Office    I discussed the limitations of evaluation and management by telemedicine and the availability of in person appointments. The patient expressed understanding and agreed to proceed.   I discussed the assessment and treatment plan with the patient. The patient was provided an opportunity to ask questions and all were answered. The patient agreed with the plan and demonstrated an understanding of the instructions.   The patient was advised to call back or seek an in-person evaluation if the symptoms worsen or if the condition fails to improve as anticipated.  I provided 65 minutes of non-face-to-face time during this encounter.   Jim Smith, OT   Occupational Therapy Treatment  Patient Details  Name: Jim Smith MRN: [id number] Date of Birth: [birth year] Referring Provider (OT): Ricky Ala   Encounter Date: 10/30/2020   OT End of Session - 10/30/20 1253    Visit Number 5    Number of Visits 20    Date for OT Re-Evaluation 11/21/20    Authorization Type [place] Deduct 3000-met No Copay Co Ins 10% 25.00 due toward until OOP is met Visit limit 60- based on medical necessity combined with PT/OT/ST No auth required -once goals are met there is no coverage for Maintenance OT    Authorization - Visit Number 5    Authorization - Number of Visits 54    OT Start Time 1105    OT Stop Time 1210    OT Time Calculation (min) 65 min    Activity Tolerance Patient tolerated treatment well    Behavior During Therapy WFL for tasks assessed/performed            Past Medical History:  Diagnosis Date  . Acute ear infection   . Anxiety   . Bronchitis   . GERD (gastroesophageal reflux disease)   . H/O removal of neck cyst 01/2012   R anterior neck I&D, with abx  . Hyperlipidemia   . Hypertension   . Migraine   . Sleep apnea    uses Bipap occaisionally    Past Surgical History:  Procedure Laterality Date  . FRACTURE SURGERY Left 2009   tibial fx  . PILONIDAL CYST EXCISION    . SUBMANDIBULAR GLAND EXCISION Right 05/17/2016   Procedure: INTRA ORAL EXCISION RIGHT SUBLINGUAL GLAND;  Surgeon: Izora Gala, MD;  Location: [facility];  Service: ENT;  Laterality: Right;    There were no vitals filed for this visit.   Subjective Assessment - 10/30/20 1253    Currently in Pain? No/denies                                OT Education - 10/30/20 1253    Education Details Educated on identifying worry and utilized circle on control tool to categorize what we do and do not have control over    Person(s) Educated Patient    Methods Explanation;Handout    Comprehension Verbalized understanding            OT Short Term Goals - 10/25/20 1435  OT SHORT TERM GOAL #1   Title Pt will actively engage in OT group sessions throughout duration of PHP programming, in order to promote daily structure, social engagement, and opportunities to develop and utilize adaptive strategies to maximize functional performance in preparation for safe transition and integration back into school, work, and the community.    Status On-going      OT SHORT TERM GOAL #2   Title Pt will practice and identify 1-3 adaptive coping strategies he can utilize, in order to safely manage increased depression/anxiety, with min cues, in preparation for safe and healthy reintegration back into the community at discharge.    Status On-going      OT SHORT TERM GOAL #3   Title Pt will demonstrate improved assertive communication, as evidenced by,  active participation in OT sessions, throughout duration of PHP programming, in order to safely transition back into the community at discharge.    Status On-going      OT SHORT TERM GOAL #4   Title Pt will identify and implement 1-3 sleep hygiene strategies he can utilize, in order to improve sleep quality/ADL performance, in preparation for safe and healthy reintegration back into the community at discharge.    Status On-going          Group Session:  S: "When I think of worry, I think of like hopelessness that it is never going to change or go away."  O: Group session encouraged increased participation and engagement through discussion focused on worry and our circle of control. Group reviewed a powerpoint that discussed healthy vs unhealthy worry with specific examples provided. Discussion also focused on utilizing the circle of control outline to identify what is within our control, what we have influence on, and what is not in our control. Group members shared specific examples and worries and identified what categories they fell in within the circle of control.   A: Jim Smith was active in his participation of discussion, though notably tangential and perseverative at times when discussing his source of worry. Pt connected his worry to situations and events that occur at work, sharing that he is in charge of a group of people at work, though has difficulty navigating this role. Pt shared that their are many flaws within the global procedures he has to follow at work, however reports they are "over costly" and "time consuming" and reports ability to complete tasks and projects in a more efficient way. Pt reports his worry comes from the fact that he cannot control these procedures and has to follow them "by the book" despite his frustration around the cost and time frame. Pt difficult to re-direct and navigate throughout discussion, often becoming tangential and frustrated with work difficulties. Pt  somewhat receptive to support and education provided, though continued to identify frustration with his ability not to control.   P: Continue to attend PHP OT group sessions 5x week for the remainder of the week to promote daily structure, social engagement, and opportunities to develop and utilize adaptive strategies to maximize functional performance in preparation for safe transition and integration back into school, work, and the community.    Plan - 10/30/20 1254    Occupational performance deficits (Please refer to evaluation for details): ADL's;IADL's;Rest and Sleep;Education;Work;Leisure;Social Participation    Body Structure / Function / Physical Skills ADL    Cognitive Skills Attention;Consciousness;Emotional;Energy/Drive;Memory;Safety Awareness;Problem Solve;Temperament/Personality;Thought;Understand    Psychosocial Skills Coping Strategies;Environmental  Adaptations;Habits;Interpersonal Interaction;Routines and Behaviors  Patient will benefit from skilled therapeutic intervention in order to improve the following deficits and impairments:   Body Structure / Function / Physical Skills: ADL Cognitive Skills: Attention, Consciousness, Emotional, Energy/Drive, Memory, Safety Awareness, Problem Solve, Temperament/Personality, Thought, Understand Psychosocial Skills: Coping Strategies, Environmental  Adaptations, Habits, Interpersonal Interaction, Routines and Behaviors   Visit Diagnosis: Difficulty coping  Frontal lobe and executive function deficit  Severe episode of recurrent major depressive disorder, without psychotic features [facility])    Problem List Patient Active Problem List   Diagnosis Date Noted  . Migraine 08/04/2017  . Sleep apnea 08/04/2017  . Anxiety 08/04/2017  . [place] of floor of mouth 05/19/2016    10/30/2020  Jim Smith, MOT, OTR/L  10/30/2020, 12:54 PM  [facility] [place], [place], [address] Phone: [phone]   Fax:  [phone]  Name: Jim Smith MRN: [id number] Date of Birth: [birth year]

## 2020-10-31 ENCOUNTER — Other Ambulatory Visit (HOSPITAL_COMMUNITY): Payer: Commercial Managed Care - PPO | Admitting: Occupational Therapy

## 2020-10-31 ENCOUNTER — Other Ambulatory Visit: Payer: Self-pay

## 2020-10-31 ENCOUNTER — Encounter (HOSPITAL_COMMUNITY): Payer: Self-pay

## 2020-10-31 ENCOUNTER — Other Ambulatory Visit (HOSPITAL_COMMUNITY): Payer: Commercial Managed Care - PPO | Admitting: Licensed Clinical Social Worker

## 2020-10-31 DIAGNOSIS — R4589 Other symptoms and signs involving emotional state: Secondary | ICD-10-CM

## 2020-10-31 DIAGNOSIS — F332 Major depressive disorder, recurrent severe without psychotic features: Secondary | ICD-10-CM

## 2020-10-31 DIAGNOSIS — R41844 Frontal lobe and executive function deficit: Secondary | ICD-10-CM

## 2020-10-31 DIAGNOSIS — F411 Generalized anxiety disorder: Secondary | ICD-10-CM

## 2020-10-31 LAB — SARS CORONAVIRUS 2 (TAT 6-24 HRS): SARS Coronavirus 2: NEGATIVE

## 2020-10-31 NOTE — Progress Notes (Signed)
Spoke with patient via webex video call, used 2 identifiers to correctly identify patient. States he has been enjoying groups. Has a lot of anxiety about returning to work for his first day back since therapy started. Discussed not being able to sleep the night before and was asking about taking Melatonin for that night. Message sent to NP for discussion. No issues or complaints. No side effects from medication. Denies SI/HI or AV hallucinations. PHQ9=12. Possible discharge on Friday this week. On scale 1-10 as 10 being worst he rates depression at 3 and anxiety at 3.

## 2020-10-31 NOTE — Therapy (Signed)
[place], [place], [address] Phone: [phone]   Fax:  [phone] Virtual Visit via Video Note  I connected with Jim Smith on 10/31/20 at  11:00 AM EDT by a video enabled telemedicine application and verified that I am speaking with the correct person using two identifiers.  Location: Patient: Patient Home Provider: Clinic Office    I discussed the limitations of evaluation and management by telemedicine and the availability of in person appointments. The patient expressed understanding and agreed to proceed.    I discussed the assessment and treatment plan with the patient. The patient was provided an opportunity to ask questions and all were answered. The patient agreed with the plan and demonstrated an understanding of the instructions.   The patient was advised to call back or seek an in-person evaluation if the symptoms worsen or if the condition fails to improve as anticipated.  I provided 60 minutes of non-face-to-face time during this encounter.   Jim Smith, OT   Occupational Therapy Treatment  Patient Details  Name: Jim Smith MRN: [id number] Date of Birth: 10/25/1966 Referring Provider (OT): Ricky Ala   Encounter Date: 10/31/2020   OT End of Session - 10/31/20 1228    Visit Number 6    Number of Visits 20    Date for OT Re-Evaluation 11/21/20    Authorization Type [place] Deduct 3000-met No Copay Co Ins 10% 25.00 due toward until OOP is met Visit limit 60- based on medical necessity combined with PT/OT/ST No auth required -once goals are met there is no coverage for Maintenance OT    Authorization - Visit Number 6    Authorization - Number of Visits 60    OT Start Time 1120    OT Stop Time 1220    OT Time Calculation (min) 60 min    Activity Tolerance Patient tolerated treatment well    Behavior During Therapy WFL for tasks assessed/performed;Flat affect            Past Medical History:  Diagnosis Date  . Acute ear infection   . Anxiety   . Bronchitis   . GERD (gastroesophageal reflux disease)   . H/O removal of neck cyst 01/2012   R anterior neck I&D, with abx  . Hyperlipidemia   . Hypertension   . Migraine   . Sleep apnea    uses Bipap occaisionally    Past Surgical History:  Procedure Laterality Date  . FRACTURE SURGERY Left 2009   tibial fx  . PILONIDAL CYST EXCISION    . SUBMANDIBULAR GLAND EXCISION Right 05/17/2016   Procedure: INTRA ORAL EXCISION RIGHT SUBLINGUAL GLAND;  Surgeon: Izora Gala, MD;  Location: [facility];  Service: ENT;  Laterality: Right;    There were no vitals filed for this visit.   Subjective Assessment - 10/31/20 1228    Currently in Pain? No/denies                                OT Education - 10/31/20 1228    Education Details Educated on different factors that contribute to our ability to manage our time, along with specific time management tips/strategies, including good sleep hygiene practices/habits    Person(s) Educated Patient    Methods Explanation;Handout    Comprehension Verbalized understanding            OT Short Term Goals -  10/25/20 1435      OT SHORT TERM GOAL #1   Title Pt will actively engage in OT group sessions throughout duration of PHP programming, in order to promote daily structure, social engagement, and opportunities to develop and utilize adaptive strategies to maximize functional performance in preparation for safe transition and integration back into school, work, and the community.    Status On-going      OT SHORT TERM GOAL #2   Title Pt will practice and identify 1-3 adaptive coping strategies he can utilize, in order to safely manage increased depression/anxiety, with min cues, in preparation for safe and healthy reintegration back into the community at discharge.    Status On-going      OT SHORT TERM GOAL #3   Title Pt  will demonstrate improved assertive communication, as evidenced by, active participation in OT sessions, throughout duration of PHP programming, in order to safely transition back into the community at discharge.    Status On-going      OT SHORT TERM GOAL #4   Title Pt will identify and implement 1-3 sleep hygiene strategies he can utilize, in order to improve sleep quality/ADL performance, in preparation for safe and healthy reintegration back into the community at discharge.    Status On-going         Group Session: S: "I don't think I have an issue with time management at work, it's more so when I get home and doing housework that I feel I procrastinate."  O: Today's group session focused on topic of Time Management. Group members identified ways in which they currently struggle with managing their time and discussion focused on alternative strategies to recognize how we can be more productive and intentional with our time and managing it appropriately.   A: Jim Smith was active in his participation of discussion, though continues to present with flat affect and tangential thought pattern. He shared that he does not have difficulty with time management at work, despite sharing on several occassions that he often is overloaded with work and cannot get it all done. He did share that he struggles more so at home and transitioning from "work mode" to "personal life" noting that he does not have the motivation or energy to engage in housework and other chores needed of himself. He shared that he often procrastinates and time "escapes" him. He appeared somewhat receptive to strategies and education offered to him by peers, though remained mostly rigid in his pattern of thinking.    P: Continue to attend PHP OT group sessions 5x week for the remainder of the week to promote daily structure, social engagement, and opportunities to develop and utilize adaptive strategies to maximize functional performance in  preparation for safe transition and integration back into school, work, and the community. Plan to continue to address topic of time management in next OT group session.            Plan - 10/31/20 1229    Occupational performance deficits (Please refer to evaluation for details): ADL's;IADL's;Rest and Sleep;Education;Work;Leisure;Social Participation    Body Structure / Function / Physical Skills ADL    Cognitive Skills Attention;Consciousness;Emotional;Energy/Drive;Memory;Safety Awareness;Problem Solve;Temperament/Personality;Thought;Understand    Psychosocial Skills Coping Strategies;Environmental  Adaptations;Habits;Interpersonal Interaction;Routines and Behaviors           Patient will benefit from skilled therapeutic intervention in order to improve the following deficits and impairments:   Body Structure / Function / Physical Skills: ADL Cognitive Skills: Attention, Consciousness, Emotional, Energy/Drive, Memory, Safety Awareness,  Problem Solve, Temperament/Personality, Thought, Understand Psychosocial Skills: Coping Strategies, Environmental  Adaptations, Habits, Interpersonal Interaction, Routines and Behaviors   Visit Diagnosis: Difficulty coping  Frontal lobe and executive function deficit  Severe episode of recurrent major depressive disorder, without psychotic features [facility])    Problem List Patient Active Problem List   Diagnosis Date Noted  . Migraine 08/04/2017  . Sleep apnea 08/04/2017  . Anxiety 08/04/2017  . [place] of floor of mouth 05/19/2016    10/31/2020  Jim Smith, MOT, OTR/L  10/31/2020, 12:29 PM  [place], [place], [address] Phone: [phone]   Fax:  [phone]  Name: Jim Smith MRN: [id number] Date of Birth: [birth year]

## 2020-11-01 ENCOUNTER — Ambulatory Visit (HOSPITAL_BASED_OUTPATIENT_CLINIC_OR_DEPARTMENT_OTHER): Payer: Commercial Managed Care - PPO | Admitting: Certified Registered Nurse Anesthetist

## 2020-11-01 ENCOUNTER — Other Ambulatory Visit: Payer: Self-pay

## 2020-11-01 ENCOUNTER — Other Ambulatory Visit (HOSPITAL_COMMUNITY): Payer: Commercial Managed Care - PPO

## 2020-11-01 ENCOUNTER — Encounter (HOSPITAL_BASED_OUTPATIENT_CLINIC_OR_DEPARTMENT_OTHER): Payer: Self-pay | Admitting: Otolaryngology

## 2020-11-01 ENCOUNTER — Encounter (HOSPITAL_BASED_OUTPATIENT_CLINIC_OR_DEPARTMENT_OTHER)
Admission: RE | Disposition: A | Discharge status: Home / Self Care | Payer: Self-pay | Source: Home / Self Care | Attending: Otolaryngology

## 2020-11-01 ENCOUNTER — Ambulatory Visit (HOSPITAL_BASED_OUTPATIENT_CLINIC_OR_DEPARTMENT_OTHER)
Admission: RE | Admit: 2020-11-01 | Discharge: 2020-11-01 | Disposition: A | Discharge status: Home / Self Care | Payer: Commercial Managed Care - PPO | Attending: Otolaryngology | Admitting: Otolaryngology

## 2020-11-01 DIAGNOSIS — Z20822 Contact with and (suspected) exposure to covid-19: Secondary | ICD-10-CM | POA: Insufficient documentation

## 2020-11-01 DIAGNOSIS — G4733 Obstructive sleep apnea (adult) (pediatric): Secondary | ICD-10-CM | POA: Diagnosis not present

## 2020-11-01 HISTORY — PX: DRUG INDUCED ENDOSCOPY: SHX6808

## 2020-11-01 HISTORY — DX: Essential (primary) hypertension: I10

## 2020-11-01 SURGERY — DRUG INDUCED SLEEP ENDOSCOPY
Anesthesia: General

## 2020-11-01 MED ORDER — OXYMETAZOLINE HCL 0.05 % NA SOLN
NASAL | Status: DC | PRN
  Administered 2020-11-01: 1 via TOPICAL

## 2020-11-01 MED ORDER — PROPOFOL 500 MG/50ML IV EMUL
INTRAVENOUS | Status: DC | PRN
  Administered 2020-11-01: 75 ug/kg/min via INTRAVENOUS

## 2020-11-01 MED ORDER — LIDOCAINE 2% (20 MG/ML) 5 ML SYRINGE
INTRAMUSCULAR | Status: DC | PRN
  Administered 2020-11-01: 60 mg via INTRAVENOUS

## 2020-11-01 MED ORDER — LACTATED RINGERS IV SOLN: INTRAVENOUS | Status: DC

## 2020-11-01 MED ORDER — FENTANYL CITRATE (PF) 100 MCG/2ML IJ SOLN: 25.0000 ug | INTRAMUSCULAR | Status: DC | PRN

## 2020-11-01 MED ORDER — OXYMETAZOLINE HCL 0.05 % NA SOLN
NASAL | Status: AC
  Filled 2020-11-01: qty 30

## 2020-11-01 SURGICAL SUPPLY — 15 items
CANISTER SUCT 1200ML W/VALVE (MISCELLANEOUS) ×2 IMPLANT
COVER WAND RF STERILE (DRAPES) IMPLANT
GLOVE BIO SURGEON STRL SZ7.5 (GLOVE) ×2 IMPLANT
GLOVE SURG SS PI 7.0 STRL IVOR (GLOVE) ×1 IMPLANT
KIT CLEAN ENDO (MISCELLANEOUS) ×2 IMPLANT
NDL PRECISIONGLIDE 27X1.5 (NEEDLE) IMPLANT
NEEDLE PRECISIONGLIDE 27X1.5 (NEEDLE) IMPLANT
PACK BASIN DAY SURGERY FS (CUSTOM PROCEDURE TRAY) ×2 IMPLANT
PATTIES SURGICAL .5 X3 (DISPOSABLE) ×2 IMPLANT
SHEET MEDIUM DRAPE 40X70 STRL (DRAPES) ×1 IMPLANT
SOL ANTI FOG 6CC (MISCELLANEOUS) ×1 IMPLANT
SOLUTION ANTI FOG 6CC (MISCELLANEOUS) ×1
SYR CONTROL 10ML LL (SYRINGE) IMPLANT
TOWEL GREEN STERILE FF (TOWEL DISPOSABLE) ×2 IMPLANT
TUBE CONNECTING 20X1/4 (TUBING) ×2 IMPLANT

## 2020-11-01 NOTE — Brief Op Note (Signed)
11/01/2020  10:21 AM  PATIENT:  Jim Smith  54 y.o. male  PRE-OPERATIVE DIAGNOSIS:  Obstructive Sleep Apnea  POST-OPERATIVE DIAGNOSIS:  Obstructive Sleep Apnea  PROCEDURE:  Procedure(s): DRUG INDUCED ENDOSCOPY (N/A)  SURGEON:  Surgeon(s) and Role:    Christia Reading, MD - Primary  PHYSICIAN ASSISTANT:   ASSISTANTS: none   ANESTHESIA:   IV sedation  EBL:  None  BLOOD ADMINISTERED:none  DRAINS: none   LOCAL MEDICATIONS USED:  NONE  SPECIMEN:  No Specimen  DISPOSITION OF SPECIMEN:  N/A  COUNTS:  YES  TOURNIQUET:  * No tourniquets in log *  DICTATION: .Note written in EPIC  PLAN OF CARE: Discharge to home after PACU  PATIENT DISPOSITION:  PACU - hemodynamically stable.   Delay start of Pharmacological VTE agent (>24hrs) due to surgical blood loss or risk of bleeding: no

## 2020-11-01 NOTE — Discharge Instructions (Signed)

## 2020-11-01 NOTE — Anesthesia Preprocedure Evaluation (Addendum)
Anesthesia Evaluation  Patient identified by MRN, date of birth, ID band Patient awake    Reviewed: Allergy & Precautions, NPO status , Patient's Chart, lab work & pertinent test results, reviewed documented beta blocker date and time   Airway Mallampati: I  TM Distance: >3 FB Neck ROM: Full    Dental  (+) Chipped, Dental Advisory Given, Caps,    Pulmonary sleep apnea ,    Pulmonary exam normal breath sounds clear to auscultation       Cardiovascular hypertension, Pt. on home beta blockers and Pt. on medications Normal cardiovascular exam Rhythm:Regular Rate:Normal  HLD   Neuro/Psych  Headaches, PSYCHIATRIC DISORDERS Anxiety    GI/Hepatic Neg liver ROS, GERD  Controlled and Medicated,  Endo/Other  negative endocrine ROS  Renal/GU negative Renal ROS  negative genitourinary   Musculoskeletal negative musculoskeletal ROS (+)   Abdominal   Peds  Hematology negative hematology ROS (+)   Anesthesia Other Findings   Reproductive/Obstetrics                            Anesthesia Physical Anesthesia Plan  ASA: III  Anesthesia Plan: General   Post-op Pain Management:    Induction: Intravenous  PONV Risk Score and Plan: 2 and Propofol infusion and Treatment may vary due to age or medical condition  Airway Management Planned: Natural Airway and Mask  Additional Equipment:   Intra-op Plan:   Post-operative Plan:   Informed Consent: I have reviewed the patients History and Physical, chart, labs and discussed the procedure including the risks, benefits and alternatives for the proposed anesthesia with the patient or authorized representative who has indicated his/her understanding and acceptance.     Dental advisory given  Plan Discussed with: CRNA  Anesthesia Plan Comments:        Anesthesia Quick Evaluation

## 2020-11-01 NOTE — Anesthesia Postprocedure Evaluation (Signed)
Anesthesia Post Note  Patient: Jim Smith  Procedure(s) Performed: DRUG INDUCED ENDOSCOPY (N/A )     Patient location during evaluation: PACU Anesthesia Type: General Level of consciousness: awake and alert Pain management: pain level controlled Vital Signs Assessment: post-procedure vital signs reviewed and stable Respiratory status: spontaneous breathing, nonlabored ventilation, respiratory function stable and patient connected to nasal cannula oxygen Cardiovascular status: blood pressure returned to baseline and stable Postop Assessment: no apparent nausea or vomiting Anesthetic complications: no   No complications documented.  Last Vitals:  Vitals:   11/01/20 1045 11/01/20 1105  BP: (!) 116/59 113/72  Pulse: (!) 47 (!) 53  Resp: (!) 8 14  Temp:  36.6 C  SpO2: 98% 100%    Last Pain:  Vitals:   11/01/20 1057  TempSrc:   PainSc: 0-No pain                 Haide Klinker L Lanny Donoso

## 2020-11-01 NOTE — H&P (Signed)
Jim Smith is an 54 y.o. male.   Chief Complaint: sleep apnea HPI: 54 year old male with obstructive sleep apnea who has been unable to tolerate CPAP.  He presents for sleep endoscopy.  Past Medical History:  Diagnosis Date  . Acute ear infection   . Anxiety   . Bronchitis   . GERD (gastroesophageal reflux disease)   . H/O removal of neck cyst 01/2012   R anterior neck I&D, with abx  . Hyperlipidemia   . Hypertension   . Migraine   . Sleep apnea    uses Bipap occaisionally    Past Surgical History:  Procedure Laterality Date  . FRACTURE SURGERY Left 2009   tibial fx  . PILONIDAL CYST EXCISION    . SUBMANDIBULAR GLAND EXCISION Right 05/17/2016   Procedure: INTRA ORAL EXCISION RIGHT SUBLINGUAL GLAND;  Surgeon: Serena Colonel, MD;  Location: [facility];  Service: ENT;  Laterality: Right;    Family History  Problem Relation Age of Onset  . Parkinson's disease Mother   . Depression Father   . Liver cancer Maternal Grandfather    Social History:  reports that he has never smoked. He has never used smokeless tobacco. He reports that he does not drink alcohol and does not use drugs.  Allergies:  Allergies  Allergen Reactions  . Penicillins Rash    Medications Prior to Admission  Medication Sig Dispense Refill  . buPROPion (WELLBUTRIN SR) 200 MG 12 hr tablet Take 200 mg by mouth 2 (two) times daily.    . montelukast (SINGULAIR) 10 MG tablet Take 10 mg by mouth every morning.    . ondansetron (ZOFRAN ODT) 4 MG disintegrating tablet Take 1 tablet (4 mg total) by mouth every 8 (eight) hours as needed for nausea or vomiting. 20 tablet 0  . pantoprazole (PROTONIX) 40 MG tablet Take 40 mg by mouth 2 (two) times daily.    . propranolol (INDERAL) 80 MG tablet Take 80 mg by mouth every morning.    . sertraline (ZOLOFT) 50 MG tablet Take 50 mg by mouth daily.    . cyclobenzaprine (FLEXERIL) 10 MG tablet Take 10 mg by mouth 3 (three) times daily as needed for muscle spasms.       Results for orders placed or performed during the hospital encounter of 11/01/20 (from the past 48 hour(s))  SARS CORONAVIRUS 2 (TAT 6-24 HRS) Nasopharyngeal Nasopharyngeal Swab     Status: None   Collection Time: 10/30/20  2:02 PM   Specimen: Nasopharyngeal Swab  Result Value Ref Range   SARS Coronavirus 2 NEGATIVE NEGATIVE    Comment: (NOTE) SARS-CoV-2 target nucleic acids are NOT DETECTED.  The SARS-CoV-2 RNA is generally detectable in upper and lower respiratory specimens during the acute phase of infection. Negative results do not preclude SARS-CoV-2 infection, do not rule out co-infections with other pathogens, and should not be used as the sole basis for treatment or other patient management decisions. Negative results must be combined with clinical observations, patient history, and epidemiological information. The expected result is Negative.  Fact Sheet for Patients: HairSlick.no  Fact Sheet for Healthcare Providers: quierodirigir.com  This test is not yet approved or cleared by the [place] FDA and  has been authorized for detection and/or diagnosis of SARS-CoV-2 by FDA under an Emergency Use Authorization (EUA). This EUA will remain  in effect (meaning this test can be used) for the duration of the COVID-19 declaration under Se ction 564(b)(1) of the Act, 21 U.S.C. section  360bbb-3(b)(1), unless the authorization is terminated or revoked sooner.  Performed at [facility], [address] [place], [place] [address]    No results found.  Review of Systems  All other systems reviewed and are negative.   Height 6\' 2"  (1.88 m), weight 93 kg. Physical Exam Constitutional:      Appearance: Normal appearance. He is normal weight.  HENT:     Head: Normocephalic and atraumatic.     Right Ear: External ear normal.     Left Ear: External ear normal.     Nose: Nose normal.     Mouth/Throat:      Mouth: Mucous membranes are moist.     Pharynx: Oropharynx is clear.  Eyes:     Extraocular Movements: Extraocular movements intact.     Conjunctiva/sclera: Conjunctivae normal.     Pupils: Pupils are equal, round, and reactive to light.  Cardiovascular:     Rate and Rhythm: Normal rate.  Pulmonary:     Effort: Pulmonary effort is normal.  Skin:    General: Skin is warm and dry.  Neurological:     General: No focal deficit present.     Mental Status: He is alert and oriented to person, place, and time.  Psychiatric:        Mood and Affect: Mood normal.        Behavior: Behavior normal.        Thought Content: Thought content normal.        Judgment: Judgment normal.      Assessment/Plan OSA  To OR for DISE.  , MD 11/01/2020, 9:34 AM

## 2020-11-01 NOTE — Op Note (Signed)
Preop diagnosis: Obstructive sleep apnea Postop diagnosis: same Procedure: Drug-induced sleep endoscopy Surgeon: Jenne Pane Anesth: IV sedation Compl: None Findings: There is 100% collapse at the velum making him a candidate for hypoglossal nerve stimulator placement.  There was very little collapse at the tongue base. Description:  After discussing risks, benefits, and alternatives, the patient was brought to the operative suite and placed on the operative table in the supine position.  Anesthesia was induced and the patient was given light sedation to simulate natural sleep. When the proper level was reached, an Afrin-soaked pledget was placed in the right nasal passage for a couple of minutes and then removed.  The fiberoptic laryngoscope was then passed to view the pharynx and larynx.  Findings are noted above and the exam was recorded.  After completion, the scope was removed and the patient was returned to anesthesia for wakeup and was moved to the recovery room in stable condition.

## 2020-11-01 NOTE — Transfer of Care (Signed)
Immediate Anesthesia Transfer of Care Note  Patient: Jim Smith  Procedure(s) Performed: DRUG INDUCED ENDOSCOPY (N/A )  Patient Location: PACU  Anesthesia Type:MAC  Level of Consciousness: awake, alert  and oriented  Airway & Oxygen Therapy: Patient Spontanous Breathing and Patient connected to nasal cannula oxygen  Post-op Assessment: Report given to RN and Post -op Vital signs reviewed and stable  Post vital signs: Reviewed and stable  Last Vitals:  Vitals Value Taken Time  BP 109/72   Temp    Pulse 52 11/01/20 1025  Resp 12 11/01/20 1025  SpO2 97 % 11/01/20 1025  Vitals shown include unvalidated device data.  Last Pain:  Vitals:   11/01/20 0936  TempSrc: Oral  PainSc: 2          Complications: No complications documented.

## 2020-11-02 ENCOUNTER — Telehealth (HOSPITAL_COMMUNITY): Payer: Self-pay | Admitting: Psychiatry

## 2020-11-02 ENCOUNTER — Other Ambulatory Visit (HOSPITAL_COMMUNITY): Payer: Commercial Managed Care - PPO | Admitting: Occupational Therapy

## 2020-11-02 ENCOUNTER — Encounter (HOSPITAL_BASED_OUTPATIENT_CLINIC_OR_DEPARTMENT_OTHER): Payer: Self-pay | Admitting: Otolaryngology

## 2020-11-02 ENCOUNTER — Other Ambulatory Visit (HOSPITAL_COMMUNITY): Payer: Commercial Managed Care - PPO | Admitting: Licensed Clinical Social Worker

## 2020-11-02 DIAGNOSIS — R4589 Other symptoms and signs involving emotional state: Secondary | ICD-10-CM

## 2020-11-02 DIAGNOSIS — F332 Major depressive disorder, recurrent severe without psychotic features: Secondary | ICD-10-CM

## 2020-11-02 DIAGNOSIS — R41844 Frontal lobe and executive function deficit: Secondary | ICD-10-CM

## 2020-11-02 DIAGNOSIS — F411 Generalized anxiety disorder: Secondary | ICD-10-CM

## 2020-11-02 NOTE — Therapy (Signed)
[facility] [place], [place], [address] Phone: [phone]   Fax:  [phone] Virtual Visit via Video Note  I connected with [name] on 11/02/20 at  11:00 AM EDT by a video enabled telemedicine application and verified that I am speaking with the correct person using two identifiers.  Location: Patient: Patient Home  Provider: Clinic Office    I discussed the limitations of evaluation and management by telemedicine and the availability of in person appointments. The patient expressed understanding and agreed to proceed.   I discussed the assessment and treatment plan with the patient. The patient was provided an opportunity to ask questions and all were answered. The patient agreed with the plan and demonstrated an understanding of the instructions.   The patient was advised to call back or seek an in-person evaluation if the symptoms worsen or if the condition fails to improve as anticipated.  I provided 60 minutes of non-face-to-face time during this encounter.   Ponciano Ort, OT   Occupational Therapy Treatment  Patient Details  Name: Balthazar Dooly MRN: [id number] Date of Birth: 06-Dec-1966 Referring Provider (OT): Ricky Ala   Encounter Date: 11/02/2020   OT End of Session - 11/02/20 1211    Visit Number 7    Number of Visits 20    Date for OT Re-Evaluation 11/21/20    Authorization Type [place] Deduct 3000-met No Copay Co Ins 10% 25.00 due toward until OOP is met Visit limit 60- based on medical necessity combined with PT/OT/ST No auth required -once goals are met there is no coverage for Maintenance OT    Authorization - Visit Number 7    Authorization - Number of Visits 60    OT Start Time 1100    OT Stop Time 1200    OT Time Calculation (min) 60 min    Activity Tolerance Patient tolerated treatment well    Behavior During Therapy WFL for tasks assessed/performed            Past Medical History:  Diagnosis Date  . Acute ear infection   . Anxiety   . Bronchitis   . GERD (gastroesophageal reflux disease)   . H/O removal of neck cyst 01/2012   R anterior neck I&D, with abx  . Hyperlipidemia   . Hypertension   . Migraine   . Sleep apnea    uses Bipap occaisionally    Past Surgical History:  Procedure Laterality Date  . DRUG INDUCED ENDOSCOPY N/A 11/01/2020   Procedure: DRUG INDUCED ENDOSCOPY;  Surgeon: Melida Quitter, MD;  Location: [facility];  Service: ENT;  Laterality: N/A;  . FRACTURE SURGERY Left 2009   tibial fx  . PILONIDAL CYST EXCISION    . SUBMANDIBULAR GLAND EXCISION Right 05/17/2016   Procedure: INTRA ORAL EXCISION RIGHT SUBLINGUAL GLAND;  Surgeon: Izora Gala, MD;  Location: [facility];  Service: ENT;  Laterality: Right;    There were no vitals filed for this visit.   Subjective Assessment - 11/02/20 1209    Currently in Pain? No/denies                                OT Education - 11/02/20 1210    Education Details Educated on different communication styles and identified strategies/tips to practice being more assertive    Person(s) Educated Patient    Methods Explanation;Handout  Comprehension Verbalized understanding            OT Short Term Goals - 10/25/20 1435      OT SHORT TERM GOAL #1   Title Pt will actively engage in OT group sessions throughout duration of PHP programming, in order to promote daily structure, social engagement, and opportunities to develop and utilize adaptive strategies to maximize functional performance in preparation for safe transition and integration back into school, work, and the community.    Status On-going      OT SHORT TERM GOAL #2   Title Pt will practice and identify 1-3 adaptive coping strategies he can utilize, in order to safely manage increased depression/anxiety, with min cues, in preparation for safe and healthy reintegration  back into the community at discharge.    Status On-going      OT SHORT TERM GOAL #3   Title Pt will demonstrate improved assertive communication, as evidenced by, active participation in OT sessions, throughout duration of PHP programming, in order to safely transition back into the community at discharge.    Status On-going      OT SHORT TERM GOAL #4   Title Pt will identify and implement 1-3 sleep hygiene strategies he can utilize, in order to improve sleep quality/ADL performance, in preparation for safe and healthy reintegration back into the community at discharge.    Status On-going         Group Session:  S: "I don't know what to say, or I say too much and I don't know when to stop. I come off as insulting."  O: Today's group focused on topic of Communication Styles. Group members were educated on the different styles including passive, aggressive, and assertive communication. Members shared and reflected on which style they most often find themselves communicating in and how to transition to a more assertive approach.   A: [name] was active in his participation of discussion and shared that he often takes a passive or an aggressive response to communication. He shared that when he speaks to people that do not know him very well, especially at work, they interpret his responses as abrasive and insulting. He shared that he does not have ill intentions, however does not know how to navigate saying "No" or responding in a way that warrants more respect or clarity. He appeared somewhat receptive to education offered, however shared that he thinks it is who he is as a person and less so difficulty with communication. Pt encouraged to challenge this thought and return to session tomorrow brainstorming strategies to practice the delivery of his responses as it relates to word choice, tone, volume, etc.   P: Continue to attend PHP OT group sessions 5x week for the remainder of the week to promote  daily structure, social engagement, and opportunities to develop and utilize adaptive strategies to maximize functional performance in preparation for safe transition and integration back into school, work, and the community. Plan to address topic of assertiveness training in next OT group session.     Plan - 11/02/20 1212    Occupational performance deficits (Please refer to evaluation for details): ADL's;IADL's;Rest and Sleep;Education;Work;Leisure;Social Participation    Body Structure / Function / Physical Skills ADL    Cognitive Skills Attention;Consciousness;Emotional;Energy/Drive;Memory;Safety Awareness;Problem Solve;Temperament/Personality;Thought;Understand    Psychosocial Skills Coping Strategies;Environmental  Adaptations;Habits;Interpersonal Interaction;Routines and Behaviors           Patient will benefit from skilled therapeutic intervention in order to improve the following deficits and impairments:  Body Structure / Function / Physical Skills: ADL Cognitive Skills: Attention, Consciousness, Emotional, Energy/Drive, Memory, Safety Awareness, Problem Solve, Temperament/Personality, Thought, Understand Psychosocial Skills: Coping Strategies, Environmental  Adaptations, Habits, Interpersonal Interaction, Routines and Behaviors   Visit Diagnosis: Difficulty coping  Frontal lobe and executive function deficit  Severe episode of recurrent major depressive disorder, without psychotic features [facility])    Problem List Patient Active Problem List   Diagnosis Date Noted  . Migraine 08/04/2017  . Sleep apnea 08/04/2017  . Anxiety 08/04/2017  . 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

## 2020-11-02 NOTE — Telephone Encounter (Signed)
D:  Pt will be transitioning from PHP to MH-IOP on 11-06-20.  A:  Placed call and oriented pt.  Encouraged pt to verify his benefits with his insurance company.  R:  Pt receptive.

## 2020-11-03 ENCOUNTER — Encounter (HOSPITAL_COMMUNITY): Payer: Self-pay

## 2020-11-03 ENCOUNTER — Other Ambulatory Visit: Payer: Self-pay

## 2020-11-03 ENCOUNTER — Other Ambulatory Visit (HOSPITAL_COMMUNITY): Payer: Commercial Managed Care - PPO | Admitting: Occupational Therapy

## 2020-11-03 ENCOUNTER — Other Ambulatory Visit (HOSPITAL_COMMUNITY): Payer: Commercial Managed Care - PPO | Admitting: Licensed Clinical Social Worker

## 2020-11-03 DIAGNOSIS — R4589 Other symptoms and signs involving emotional state: Secondary | ICD-10-CM

## 2020-11-03 DIAGNOSIS — R41844 Frontal lobe and executive function deficit: Secondary | ICD-10-CM

## 2020-11-03 DIAGNOSIS — F411 Generalized anxiety disorder: Secondary | ICD-10-CM

## 2020-11-03 DIAGNOSIS — F332 Major depressive disorder, recurrent severe without psychotic features: Secondary | ICD-10-CM | POA: Diagnosis not present

## 2020-11-03 NOTE — Therapy (Signed)
[facility] [place] [place], [place], [address] Phone: [phone]   Fax:  [phone] Virtual Visit via Video Note  I connected with Jim Smith on 11/03/20 at  11:00 AM EDT by a video enabled telemedicine application and verified that I am speaking with the correct person using two identifiers.  Location: Patient: Patient Home Provider: Clinic Office    I discussed the limitations of evaluation and management by telemedicine and the availability of in person appointments. The patient expressed understanding and agreed to proceed.   I discussed the assessment and treatment plan with the patient. The patient was provided an opportunity to ask questions and all were answered. The patient agreed with the plan and demonstrated an understanding of the instructions.   The patient was advised to call back or seek an in-person evaluation if the symptoms worsen or if the condition fails to improve as anticipated.  I provided 65 minutes of non-face-to-face time during this encounter.   Ponciano Ort, OT   Occupational Therapy Treatment  Patient Details  Name: Jim Smith MRN: [id number] Date of Birth: 1966/04/22 Referring Provider (OT): Ricky Ala   Encounter Date: 11/03/2020   OT End of Session - 11/03/20 1202    Visit Number 8    Number of Visits 20    Date for OT Re-Evaluation 11/21/20    Authorization Type [place] Deduct 3000-met No Copay Co Ins 10% 25.00 due toward until OOP is met Visit limit 60- based on medical necessity combined with PT/OT/ST No auth required -once goals are met there is no coverage for Maintenance OT    Authorization - Visit Number 8    Authorization - Number of Visits 73    OT Start Time 1055    OT Stop Time 1200    OT Time Calculation (min) 65 min    Activity Tolerance Patient tolerated treatment well    Behavior During Therapy WFL for tasks assessed/performed            Past Medical History:  Diagnosis Date   Acute ear infection    Anxiety    Bronchitis    GERD (gastroesophageal reflux disease)    H/O removal of neck cyst 01/2012   R anterior neck I&D, with abx   Hyperlipidemia    Hypertension    Migraine    Sleep apnea    uses Bipap occaisionally    Past Surgical History:  Procedure Laterality Date   DRUG INDUCED ENDOSCOPY N/A 11/01/2020   Procedure: DRUG INDUCED ENDOSCOPY;  Surgeon: Melida Quitter, MD;  Location: [facility];  Service: ENT;  Laterality: N/A;   FRACTURE SURGERY Left 2009   tibial fx   PILONIDAL CYST EXCISION     SUBMANDIBULAR GLAND EXCISION Right 05/17/2016   Procedure: INTRA ORAL EXCISION RIGHT SUBLINGUAL GLAND;  Surgeon: Izora Gala, MD;  Location: [facility];  Service: ENT;  Laterality: Right;    There were no vitals filed for this visit.   Subjective Assessment - 11/03/20 1202    Currently in Pain? No/denies                                OT Education - 11/03/20 1202    Education Details Educated on different communication styles with strategies to become more assertive with use of XYZ communication tool    Person(s) Educated Patient    Methods Explanation;Handout  Comprehension Verbalized understanding            OT Short Term Goals - 11/03/20 1203      OT SHORT TERM GOAL #1   Title Pt will actively engage in OT group sessions throughout duration of PHP programming, in order to promote daily structure, social engagement, and opportunities to develop and utilize adaptive strategies to maximize functional performance in preparation for safe transition and integration back into school, work, and the community.    Status Achieved      OT SHORT TERM GOAL #2   Title Pt will practice and identify 1-3 adaptive coping strategies he can utilize, in order to safely manage increased depression/anxiety, with min cues, in preparation for safe and healthy  reintegration back into the community at discharge.    Status Achieved      OT SHORT TERM GOAL #3   Title Pt will demonstrate improved assertive communication, as evidenced by, active participation in OT sessions, throughout duration of PHP programming, in order to safely transition back into the community at discharge.    Status Achieved      OT SHORT TERM GOAL #4   Title Pt will identify and implement 1-3 sleep hygiene strategies he can utilize, in order to improve sleep quality/ADL performance, in preparation for safe and healthy reintegration back into the community at discharge.    Status Achieved         Group Session:  S: "A couple of years ago my daughter wanted to dye her hair and I did not communicate appropriately and it was all mis-interpreted on both ends."  O: Group began with a reflection from previous OT session focused on communication styles and group members re-iterated what was learned during previous session. Members shared and reflected on any opportunities they were presented with last evening to practice their assertiveness skills or recognize patterns of communication observed. Today's group focused on assertiveness skills training and use of the XYZ* assertive communication tool was introduced. The XYZ communication tool states: I feel X when you do Y in situation Z and I would like _________. X is the emotion, Y is the specific behavior, and Z is the specific situation. Group members each formulated their own XYZ statement and shared with the group to discuss and offer feedback. Additional tips and strategies to practice being assertive were also introduced and discussed.   A: Jim Smith was active in his participation of discussion and shared an example from the past in which he had a conversation with his daughter about her wanting to dye her hair. He shared that he expressed his opinion in a way that came off aggressively and his daughter became passive and dropped the  conversation. He reports that a solution was never determined and he re-approached the conversation a year later with different results, recognizing the need to be assertive, clear, and respectful. Pt appeared receptive and appreciative of information and education received, including use on XYZ statement learned in group.   P: Plan to discharge patient, effective today, 11/03/2020, with plan to transition to [place] with a tentative start date of Monday 11/06/20  OCCUPATIONAL THERAPY DISCHARGE SUMMARY  Visits from Start of Care: 8   Current functional level related to goals / functional outcomes: Pt has achieved all of his OT goals within [facility] program and is scheduled to discharge today with a plan to follow up with on-going treatment and [facility]OP beginning Monday 11/06/20   Remaining deficits: See above    Education /  Equipment: See above   Plan: Patient agrees to discharge.  Patient goals were met. Patient is being discharged due to meeting the stated rehab goals.  ?????         Plan - 11/03/20 1203    Occupational performance deficits (Please refer to evaluation for details): ADL's;IADL's;Rest and Sleep;Education;Work;Leisure;Social Participation    Body Structure / Function / Physical Skills ADL    Cognitive Skills Attention;Consciousness;Emotional;Energy/Drive;Memory;Safety Awareness;Problem Solve;Temperament/Personality;Thought;Understand    Psychosocial Skills Coping Strategies;Environmental  Adaptations;Habits;Interpersonal Interaction;Routines and Behaviors           Patient will benefit from skilled therapeutic intervention in order to improve the following deficits and impairments:   Body Structure / Function / Physical Skills: ADL Cognitive Skills: Attention, Consciousness, Emotional, Energy/Drive, Memory, Safety Awareness, Problem Solve, Temperament/Personality, Thought, Understand Psychosocial Skills: Coping Strategies, Environmental  Adaptations, Habits, Interpersonal  Interaction, Routines and Behaviors   Visit Diagnosis: Difficulty coping  Frontal lobe and executive function deficit  Severe episode of recurrent major depressive disorder, without psychotic features [facility])    Problem List Patient Active Problem List   Diagnosis Date Noted   Migraine 08/04/2017   Sleep apnea 08/04/2017   Anxiety 08/04/2017   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

## 2020-11-04 ENCOUNTER — Encounter (HOSPITAL_COMMUNITY): Payer: Self-pay | Admitting: Family

## 2020-11-04 NOTE — Progress Notes (Signed)
Virtual Visit via Video Note  I connected with Jim Smith on 11/04/20 at  9:00 AM EDT by a video enabled telemedicine application and verified that I am speaking with the correct person using two identifiers.  Location: Patient: Home Provider: IOP   I discussed the limitations of evaluation and management by telemedicine and the availability of in person appointments. The patient expressed understanding and agreed to proceed.   I discussed the assessment and treatment plan with the patient. The patient was provided an opportunity to ask questions and all were answered. The patient agreed with the plan and demonstrated an understanding of the instructions.   The patient was advised to call back or seek an in-person evaluation if the symptoms worsen or if the condition fails to improve as anticipated.  I provided 15 minutes of non-face-to-face time during this encounter.   Jim Rack, NP    Health Partial Hosptilization Outpatient Program Discharge Summary  Jim Smith [id number]  Admission date: 10/24/2020 Discharge date: 11/03/2020  Reason for admission: Per admission assessment note:Patient is a 54 y.o. Caucasian male presents with depression and anxiety mainly due to work related stressors.  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  Reports he has been employed there for the past 21 years. Stated that he has been unable to let situation go states he ruminates on problems constantly.  States this is starting to affect his marriage as he just comes home to "rant" about workplace stressors.  Patient reports he feels frustrated and continues to do well on every day situation. Patient was enrolled in partial psychiatric program on 10/24/20  Chemical Use History: Denied  Family of Origin Issues: Reports his wife continues to be supportive.  Progress in Program Toward Treatment Goals: Ongoing, patient attended and participated with  daily group sessions with active and engaged participation.  As reported in treatment team patient continues to be close minded regarding diagnoses and treatment.  Patient to transition to Intensive outpatient programming.  During weekly follow-up patient is reluctant to do any medication adjustments at this time. NP attempted to follow-up with sleeping hygiene and medication management. Jim Smith to continue daily group session. Support, encouragement and reassurance was provided.  Progress (rationale): Stepping down to Intensive outpatient program (IOP)   Take all medications as prescribed. Keep all follow-up appointments as scheduled.  Do not consume alcohol or use illegal drugs while on prescription medications. Report any adverse effects from your medications to your primary care provider promptly.  In the event of recurrent symptoms or worsening symptoms, call 911, a crisis hotline, or go to the nearest emergency department for evaluation.   Jim Rack, NP 11/04/2020

## 2020-11-06 ENCOUNTER — Encounter (HOSPITAL_COMMUNITY): Payer: Self-pay | Admitting: Psychiatry

## 2020-11-06 ENCOUNTER — Other Ambulatory Visit (HOSPITAL_COMMUNITY): Payer: Commercial Managed Care - PPO | Admitting: Psychiatry

## 2020-11-06 ENCOUNTER — Other Ambulatory Visit: Payer: Self-pay

## 2020-11-06 DIAGNOSIS — F332 Major depressive disorder, recurrent severe without psychotic features: Secondary | ICD-10-CM | POA: Diagnosis not present

## 2020-11-06 DIAGNOSIS — F411 Generalized anxiety disorder: Secondary | ICD-10-CM

## 2020-11-06 NOTE — Progress Notes (Addendum)
Virtual Visit via Video Note  I connected with Jim Smith on 11/06/20 at  9:00 AM EST by a video enabled telemedicine application and verified that I am speaking with the correct person using two identifiers.  At orientation to the IOP program, Case Manager discussed the limitations of evaluation and management by telemedicine and the availability of in person appointments. The patient expressed understanding and agreed to proceed with virtual visits throughout the duration of the program.   Location:  Patient: Patient Home Provider: Home Office   History of Present Illness: MDD and GAD  Observations/Objective: Check In: Case Manager checked in with all participants to review discharge dates, insurance authorizations, work-related documents and needs for the treatment team. Counselor facilitated a check-in with group members to assess mood and current functioning. Today is the client's first IOP session, sharing need for treatment, expectations of program, about his support system and current life stressors. Client reports high anxiety related to work-life balance. Client participated and engaged well in discussion. Client presents with moderate depression and moderate anxiety. Client denied any current SI/HI/psychosis.   Initial Therapeutic Activity: Counselor held discussion about work related stressors and anxiety, as several group members mentioned this as a stuck point in their recovery. Counselor prompted group members to identify what's working, what's not working, things in their locus of control and those outside their control. Group members discussed ways they have attempted to address issues. Counselor provided some strategies in assertive communication, boundary setting, goal setting and future planning. Client stated that he has a better understanding of the questions to ask himself to make a more informed decision about advocacy on he job verses choosing an alternative route within  his field.  Second Therapeutic Activity: Counselor spent time exposing group members to and teaching a variety of breathing techniques to better manage stress and anxiety. Counselor prompted group members to engage in activities and to give feedback on each technique. Client stated that after the activities they felt relaxed and more equipped to apply techniques in the moment.  Check Out: Counselor prompted group members to share their plans for implementing self-care or to engage in a productivity activity. Client plans to attend physical therapy to care for his physical needs today to better manage mental health symptoms. Client indicated that they are not currently a risk to self of others before signing off.  Assessment and Plan: Clinician recommends that Client remain in IOP treatment to better manage mental health symptoms, stabilization and to address treatment plan goals. Clinician recommends adherence to crisis/safety plan, taking medications as prescribed, and following up with medical professionals if any issues arise.   Follow Up Instructions: Clinician will send Webex link for next session. The Client was advised to call back or seek an in-person evaluation if the symptoms worsen or if the condition fails to improve as anticipated.     I provided 180 minutes of non-face-to-face time during this encounter.     Hilbert Odor, LCSW

## 2020-11-06 NOTE — Progress Notes (Signed)
Virtual Visit via Video Note  I connected with Jim Smith on @TODAY @ at  9:00 AM EST by a video enabled telemedicine application and verified that I am speaking with the correct person using two identifiers.  Location: Patient: at home Provider: at office   I discussed the limitations of evaluation and management by telemedicine and the availability of in person appointments. The patient expressed understanding and agreed to proceed.   I discussed the assessment and treatment plan with the patient. The patient was provided an opportunity to ask questions and all were answered. The patient agreed with the plan and demonstrated an understanding of the instructions.   The patient was advised to call back or seek an in-person evaluation if the symptoms worsen or if the condition fails to improve as anticipated.  I provided 20 minutes of non-face-to-face time during this encounter.   Patient ID: Jim Smith, male   DOB: 09-27-1966, 54 y.o.   MRN: [id number] D:  As per previous CCA states:  Pt presents as referral for PHP from his PCP due to increasing anxiety and depression. Pt reports work is his main stressor and he struggles to detach from work issues once home. Pt reports struggles with isolating, interpersonal dynamics, and becoming easily frustrated and that it is affecting his daily functioning. Pt states MH struggles on his paternal line and that his great-grandfather and grandfather died by suicide and his father has alcohol and anger problems. Pt vehemently denies SI however is concerned that his genetics are affecting him. Pt struggles with accepting depression symptoms due to this family history. 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 Pt denies AVH and substance use concerns. Patient's Currently Reported Symptoms/Problems: Pt reports high anxiety, rumination, isolation, difficulty connecting with others, depressed mood, irritability,  tearfulness, and sleep disturbances.  Patient transitioned from [facility] to MH-IOP today.  States he learned a lot of coping skills in PHP and is looking forward to learning more.  Pt reports that he is very anxious about returning to work next week.  Denies SI/HI or A/V hallucinations.  On a scale of 1-10 (10 being the worst); pt rated his anxiety and depression #3's. A:  Oriented pt to virtual MH-IOP.  Pt gave verbal consent for treatment, to release chart information to referred providers and to complete any forms if needed.  Pt also gave consent for attending group virtually d/t COVID-19 social distancing restrictions.  Encouraged support groups.  Pt will be referred to a psychiatrist and a therapist if he doesn't have anyone.  R:  Patient receptive.  [facility], M.Ed,CNA

## 2020-11-06 NOTE — Progress Notes (Signed)
Virtual Visit via Video Note  I connected with Jim Smith on 11/06/20 at  9:00 AM EST by a video enabled telemedicine application and verified that I am speaking with the correct person using two identifiers.  Location: Patient: Home Provider: Home   I discussed the limitations of evaluation and management by telemedicine and the availability of in person appointments. The patient expressed understanding and agreed to proceed.   I discussed the assessment and treatment plan with the patient. The patient was provided an opportunity to ask questions and all were answered. The patient agreed with the plan and demonstrated an understanding of the instructions.   The patient was advised to call back or seek an in-person evaluation if the symptoms worsen or if the condition fails to improve as anticipated.  I provided 15  minutes of non-face-to-face time during this encounter.   Oneta Rack, NP    Psychiatric Initial Adult Assessment   Patient Identification: Jim Smith MRN:  [id number] Date of Evaluation:  11/06/2020 Referral Source: Step down from [facility] Chief Complaint:  Depression  Visit Diagnosis: No diagnosis found.  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 Stated that he has beenunable to let situation go states he ruminates on problems constantly. States this is starting to affect his marriage as he just comes home to "rant"about workplace stressors. Patient reports he feels frustrated and continues to do well on every day situation.  Evaluation at discharge: Jim Smith continues to report mild anxiety and symptoms of worries about returning back to work. Discussed starting hydroxyzine for reported symptoms, patient  declined. Stated he would like to utilized skills that he has learned in [facility]. Jim Smith is denying suicidal or homicidal ideations. Reported some restless nights but overall his sleeping hygiene is getting better. Jim Smith to start Intensive Outpatient programing (IOP) 11/8/202021  Associated Signs/Symptoms: Depression Symptoms:  depressed mood, difficulty concentrating, impaired memory, anxiety, (Hypo) Manic Symptoms:  Distractibility, Irritable Mood, Anxiety Symptoms:  Excessive Worry, Psychotic Symptoms:  Hallucinations: None PTSD Symptoms: NA  Past Psychiatric History:  Previous Psychotropic Medications: No   Substance Abuse History in the last 12 months:  No.  Consequences of Substance Abuse: NA  Past Medical History:  Past Medical History:  Diagnosis Date  . Acute ear infection   . Anxiety   . Bronchitis   . GERD (gastroesophageal reflux disease)   . H/O removal of neck cyst 01/2012   R anterior neck I&D, with abx  . Hyperlipidemia   . Hypertension   . Migraine   . Sleep apnea    uses Bipap occaisionally    Past Surgical History:  Procedure Laterality Date  . DRUG INDUCED ENDOSCOPY N/A 11/01/2020   Procedure: DRUG INDUCED ENDOSCOPY;  Surgeon: Christia Reading, MD;  Location: [facility];  Service: ENT;  Laterality: N/A;  . FRACTURE SURGERY Left 2009   tibial fx  . PILONIDAL CYST EXCISION    . SUBMANDIBULAR GLAND EXCISION Right 05/17/2016   Procedure: INTRA ORAL EXCISION RIGHT SUBLINGUAL GLAND;  Surgeon: Serena Colonel, MD;  Location: [facility];  Service: ENT;  Laterality: Right;    Family Psychiatric History:   Family History:  Family History  Problem Relation Age of Onset  . Parkinson's disease Mother   . Depression Father   .  Liver cancer Maternal Grandfather     Social History:   Social History   Socioeconomic History  . Marital status: Married    Spouse name: Not on file  . Number of children: 1  . Years of education: Not on  file  . Highest education level: Not on file  Occupational History  . Not on file  Tobacco Use  . Smoking status: Never Smoker  . Smokeless tobacco: Never Used  Substance and Sexual Activity  . Alcohol use: No  . Drug use: No  . Sexual activity: Not on file  Other Topics Concern  . Not on file  Social History Narrative   Lives home with wife, Jim Smith.  and daughter.  [employer],  Education BS [place].  One child.  Caffeine 2-3 drinks daily.     Social Determinants of Health   Financial Resource Strain:   . Difficulty of Paying Living Expenses: Not on file  Food Insecurity:   . Worried About Programme researcher, broadcasting/film/video in the Last Year: Not on file  . Ran Out of Food in the Last Year: Not on file  Transportation Needs:   . Lack of Transportation (Medical): Not on file  . Lack of Transportation (Non-Medical): Not on file  Physical Activity:   . Days of Exercise per Week: Not on file  . Minutes of Exercise per Session: Not on file  Stress:   . Feeling of Stress : Not on file  Social Connections:   . Frequency of Communication with Friends and Family: Not on file  . Frequency of Social Gatherings with Friends and Family: Not on file  . Attends Religious Services: Not on file  . Active Member of Clubs or Organizations: Not on file  . Attends Banker Meetings: Not on file  . Marital Status: Not on file    Additional Social History:   Allergies:   Allergies  Allergen Reactions  . Penicillins Rash    Metabolic Disorder Labs: No results found for: HGBA1C, MPG No results found for: PROLACTIN No results found for: CHOL, TRIG, HDL, CHOLHDL, VLDL, LDLCALC No results found for: TSH  Therapeutic Level Labs: No results found for: LITHIUM No results found for: CBMZ No results found for: VALPROATE  Current Medications: Current Outpatient Medications  Medication Sig Dispense Refill  . buPROPion (WELLBUTRIN SR) 200 MG 12 hr tablet Take 200 mg by mouth 2 (two) times daily.     . cyclobenzaprine (FLEXERIL) 10 MG tablet Take 10 mg by mouth 3 (three) times daily as needed for muscle spasms.    . montelukast (SINGULAIR) 10 MG tablet Take 10 mg by mouth every morning.    . ondansetron (ZOFRAN ODT) 4 MG disintegrating tablet Take 1 tablet (4 mg total) by mouth every 8 (eight) hours as needed for nausea or vomiting. 20 tablet 0  . pantoprazole (PROTONIX) 40 MG tablet Take 40 mg by mouth 2 (two) times daily.    . propranolol (INDERAL) 80 MG tablet Take 80 mg by mouth every morning.    . sertraline (ZOLOFT) 50 MG tablet Take 50 mg by mouth daily.     No current facility-administered medications for this visit.    Musculoskeletal:   Psychiatric Specialty Exam: Review of Systems  There were no vitals taken for this visit.There is no height or weight on file to calculate BMI.  General Appearance: Casual  Eye Contact:  Good  Speech:  Clear and Coherent  Volume:  Decreased  Mood:  Anxious  and Depressed  Affect:  Congruent  Thought Process:  Coherent  Orientation:  Full (Time, Place, and Person)  Thought Content:  Logical  Suicidal Thoughts:  No  Homicidal Thoughts:  No  Memory:  Immediate;   Fair Recent;   Fair  Judgement:  Fair  Insight:  Fair  Psychomotor Activity:  Normal  Concentration:  Concentration: Fair  Recall:  [employer] of Knowledge:Fair  Language: Fair  Akathisia:  No  Handed:  Right  AIMS (if indicated):    Assets:  Communication Skills Desire for Improvement Social Support  ADL's:  Intact  Cognition: WNL  Sleep:  Fair reported 5 to 6 hours nightly   Screenings: PHQ2-9     Counselor from 10/31/2020 in BEHAVIORAL HEALTH PARTIAL HOSPITALIZATION PROGRAM Counselor from 10/24/2020 in BEHAVIORAL HEALTH PARTIAL HOSPITALIZATION PROGRAM  PHQ-2 Total Score 4 6  PHQ-9 Total Score 12 21      Assessment and Plan:  Start Intrnsive Outpatient programing  Continue medication as directed  Treatment plan was reviewed and agreed upon by NP T.Selyna Klahn  and patient Jim Smith need for group services.     Oneta Rack, NP 11/8/20219:29 AM

## 2020-11-06 NOTE — Psych (Signed)
Virtual Visit via Video Note  I connected with Jim Smith on 10/24/20 at  9:00 AM EDT by a video enabled telemedicine application and verified that I am speaking with the correct person using two identifiers.  Location: Patient: patient home Provider: clinical home office   I discussed the limitations of evaluation and management by telemedicine and the availability of in person appointments. The patient expressed understanding and agreed to proceed.  I discussed the assessment and treatment plan with the patient. The patient was provided an opportunity to ask questions and all were answered. The patient agreed with the plan and demonstrated an understanding of the instructions.   The patient was advised to call back or seek an in-person evaluation if the symptoms worsen or if the condition fails to improve as anticipated.  Pt was provided 240 minutes of non-face-to-face time during this encounter.   Donia Guiles, LCSW    [facility] PHP THERAPIST PROGRESS NOTE  Jim Smith [id number]  Session Time: 9:00 - 10:00  Participation Level: Active  Behavioral Response: CasualAlertAnxious  Type of Therapy: Group Therapy  Treatment Goals addressed: Coping  Interventions: CBT, DBT, Supportive and Reframing  Summary:  Clinician led check-in regarding current stressors and situation, and review of patient completed daily inventory. Clinician utilized active listening and empathetic response and validated patient emotions. Clinician facilitated processing group on pertinent issues.   Therapist Response: Jim Smith is a 54 y.o. male who presents with anxiety and depression symptoms. Patient arrived within time allowed and reports that he is feeling "optimistic." Patient rates hismood at [facility] a scale of 1-10 with 10 being great. Pt reports he attended his first PT session yesterday and worked on Education officer, museum for group. Pt reports struggling with anger. Pt able to process. Pt engaged  in discussion.     Session Time: 10:00 - 11:00   Participation Level:Active  Behavioral Response:CasualAlertDepressed  Type of Therapy:GroupTherapy  Treatment Goals addressed: Coping  Interventions:CBT, DBT, Supportive and Reframing  Summary:Cln led discussion on accountability and the balance between taking responsibility and not beating ourselves up. Group members shared current consequences they are dealing with and how they are processing them. Cln encouraged pt's to utilize the Best Friend Test to make it easier to offer themselves kindness.   Therapist Response: Pt engaged in discussion and is able to process.       Session Time: 11:00- 12:00  Participation Level:Active  Behavioral Response:CasualAlertDepressed  Type of Therapy: Group Therapy, OT  Treatment Goals addressed: Coping  Interventions:Psychosocial skills training, Supportive  Summary:Occupational Therapy group  Therapist Response:Patient engaged in group. See OT note.      Session Time: 12:00 -1:00  Participation Level:Active  Behavioral Response:CasualAlertDepressed  Type of Therapy:Group therapy  Treatment Goals addressed: Coping  Interventions:CBT; Solution focused; Supportive; Reframing  Summary:12:00 - 12:50:Cln led discussion on CBT thinking error: mind reading. Cln worked with group members to identify examples of mind reading and the consequences that can come. Group shared ways in which mind reading has been an inssue for them and barriers to working on it.  12:50 -1:00 Clinician led check-out. Clinician assessed for immediate needs, medication compliance and efficacy, and safety concerns  Therapist Response:12:00 - 12:50: Pt engaged in discussion and reports understanding of mind reading. Pt struggled with committing to the concept.  12:50 - 1:00: At check-out, patientrates his mood at [facility] a scale of 1-10 with 10 being  great.Pt reports afternoon plans of taking a drive. Pt demonstrates some progress as evidenced by  participating in first group session. Patient denies SI/HI/self-harm at the end of group.    Suicidal/Homicidal: Nowithout intent/plan  Plan: Pt will continue in PHP while working to decrease depression and anxiety symptoms, increase ability to manage symptoms in a healthy manner, and increase emotion regulation.  Diagnosis: GAD (generalized anxiety disorder) [F41.1]    1. GAD (generalized anxiety disorder)   2. Severe episode of recurrent major depressive disorder, without psychotic features (HCC)       Donia Guiles, LCSW 11/06/2020

## 2020-11-06 NOTE — Psych (Signed)
Virtual Visit via Video Note  I connected with Jim Smith on 10/25/20 at  9:00 AM EDT by a video enabled telemedicine application and verified that I am speaking with the correct person using two identifiers.  Location: Patient: patient home Provider: clinical home office   I discussed the limitations of evaluation and management by telemedicine and the availability of in person appointments. The patient expressed understanding and agreed to proceed.  I discussed the assessment and treatment plan with the patient. The patient was provided an opportunity to ask questions and all were answered. The patient agreed with the plan and demonstrated an understanding of the instructions.   The patient was advised to call back or seek an in-person evaluation if the symptoms worsen or if the condition fails to improve as anticipated.  Pt was provided 240 minutes of non-face-to-face time during this encounter.   Donia Guiles, LCSW    [facility] PHP THERAPIST PROGRESS NOTE  Jim Smith [id number]  Session Time: 9:00 - 10:00  Participation Level: Active  Behavioral Response: CasualAlertAnxious  Type of Therapy: Group Therapy  Treatment Goals addressed: Coping  Interventions: CBT, DBT, Supportive and Reframing  Summary:  Clinician led check-in regarding current stressors and situation, and review of patient completed daily inventory. Clinician utilized active listening and empathetic response and validated patient emotions. Clinician facilitated processing group on pertinent issues.   Therapist Response: Jim Smith is a 54 y.o. male who presents with anxiety and depression symptoms. Patient arrived within time allowed and reports that he is feeling "pretty good." Patient rates hismood at [facility] a scale of 1-10 with 10 being great. Pt reports he took a drive and listened to music yesterday. Pt reports struggle with doing what is helpful in the moment. Pt able to process. Pt engaged in  discussion.     Session Time: 10:00 - 11:00   Participation Level:Active  Behavioral Response:CasualAlertDepressed  Type of Therapy:GroupTherapy  Treatment Goals addressed: Coping  Interventions:CBT, DBT, Supportive and Reframing  Summary:Cln led discussion on rumination: and how it can affect [place] negatively. Group members share topics, situations, and times in which rumination is most problem problematic for them. Cln encouraged pt's to consider DBT distraction and STOP skills or CBT thought challenging to manage rumination.   Therapist Response: Pt engaged in discussion and identifies rumination is most problematic for him when it is related to work.       Session Time: 11:00- 12:00  Participation Level:Active  Behavioral Response:CasualAlertDepressed  Type of Therapy: Group therapy  Treatment Goals addressed: Coping  Interventions:Supportive, Reframing  Summary:Spiritual Care group  Therapist Response:Pt engaged in session. See chaplain note      Session Time: 12:00 -1:00  Participation Level:Active  Behavioral Response:CasualAlertDepressed  Type of Therapy:Group therapy  Treatment Goals addressed: Coping  Interventions:Psychosocial skills training, Supportive  Summary:12:00 - 12:50:Occupational Therapy group 12:50 -1:00 Clinician led check-out. Clinician assessed for immediate needs, medication compliance and efficacy, and safety concerns  Therapist Response:12:00 - 12:50:Patient engaged in group. See OT note.   12:50 - 1:00: At check-out, patientrates his mood at [facility] a scale of 1-10 with 10 being great.Pt reports afternoon plans of researching a hobby. Pt demonstrates some progress as evidenced by increased effort into healthy behaviors. Patient denies SI/HI/self-harm at the end of group.    Suicidal/Homicidal: Nowithout intent/plan  Plan: Pt will continue in PHP while working to decrease  depression and anxiety symptoms, increase ability to manage symptoms in a healthy manner, and increase emotion regulation.  Diagnosis:  GAD (generalized anxiety disorder) [F41.1]    1. GAD (generalized anxiety disorder)   2. Severe episode of recurrent major depressive disorder, without psychotic features (HCC)       Donia Guiles, LCSW 11/06/2020

## 2020-11-07 ENCOUNTER — Other Ambulatory Visit: Payer: Self-pay

## 2020-11-07 ENCOUNTER — Other Ambulatory Visit (HOSPITAL_COMMUNITY): Payer: Commercial Managed Care - PPO | Admitting: Psychiatry

## 2020-11-07 DIAGNOSIS — F332 Major depressive disorder, recurrent severe without psychotic features: Secondary | ICD-10-CM

## 2020-11-07 DIAGNOSIS — F411 Generalized anxiety disorder: Secondary | ICD-10-CM

## 2020-11-08 ENCOUNTER — Encounter (HOSPITAL_COMMUNITY): Payer: Self-pay

## 2020-11-08 ENCOUNTER — Other Ambulatory Visit: Payer: Self-pay

## 2020-11-08 ENCOUNTER — Encounter (HOSPITAL_COMMUNITY): Payer: Self-pay | Admitting: Psychiatry

## 2020-11-08 ENCOUNTER — Other Ambulatory Visit (HOSPITAL_COMMUNITY): Payer: Commercial Managed Care - PPO | Admitting: Psychiatry

## 2020-11-08 DIAGNOSIS — F332 Major depressive disorder, recurrent severe without psychotic features: Secondary | ICD-10-CM

## 2020-11-08 DIAGNOSIS — F411 Generalized anxiety disorder: Secondary | ICD-10-CM

## 2020-11-08 NOTE — Psych (Signed)
Virtual Visit via Video Note  I connected with Jim Smith on 10/30/20 at  9:00 AM EDT by a video enabled telemedicine application and verified that I am speaking with the correct person using two identifiers.  Location: Patient: patient home Provider: clinical home office   I discussed the limitations of evaluation and management by telemedicine and the availability of in person appointments. The patient expressed understanding and agreed to proceed.  I discussed the assessment and treatment plan with the patient. The patient was provided an opportunity to ask questions and all were answered. The patient agreed with the plan and demonstrated an understanding of the instructions.   The patient was advised to call back or seek an in-person evaluation if the symptoms worsen or if the condition fails to improve as anticipated.  Pt was provided 240 minutes of non-face-to-face time during this encounter.   Donia Guiles, LCSW    [facility] PHP THERAPIST PROGRESS NOTE  Jim Smith [id number]  Session Time: 9:00 - 10:00  Participation Level: Active  Behavioral Response: CasualAlertAnxious  Type of Therapy: Group Therapy  Treatment Goals addressed: Coping  Interventions: CBT, DBT, Supportive and Reframing  Summary:  Clinician led check-in regarding current stressors and situation, and review of patient completed daily inventory. Clinician utilized active listening and empathetic response and validated patient emotions. Clinician facilitated processing group on pertinent issues.   Therapist Response: [name] is a 54 y.o. male who presents with anxiety and depression symptoms. Patient arrived within time allowed and reports that he is feeling "neutral." Patient rates hismood at [facility] a scale of 1-10 with 10 being great. Pt reports his weekend went well and he stayed at home. Pt states continued struggles with sleep. Pt able to process. Pt engaged in  discussion.     Session Time: 10:00 - 11:00   Participation Level:Active  Behavioral Response:CasualAlertDepressed  Type of Therapy:GroupTherapy  Treatment Goals addressed: Coping  Interventions:CBT, DBT, Supportive and Reframing  Summary: Cln led discussion on anxious behaviors. Group members shared patterns of anxiety they experience. Cln encouraged pt's to increase insight into the roots of their anxious behaviors to be able to address triggers.   Therapist Response: Pt engaged in discussion and reports increased insight into triggers.      Session Time: 11:00- 12:00  Participation Level:Active  Behavioral Response:CasualAlertDepressed  Type of Therapy: Group Therapy, OT  Treatment Goals addressed: Coping  Interventions:Psychosocial skills training, Supportive  Summary:Occupational Therapy group  Therapist Response:Patient engaged in group. See OT note.      Session Time: 12:00 -1:00  Participation Level:Active  Behavioral Response:CasualAlertDepressed  Type of Therapy:Group therapy  Treatment Goals addressed: Coping  Interventions:CBT; Solution focused; Supportive; Reframing  Summary:12:00 - 12:50:Cln continued topic of CBT cognitive distortions and introduced thought challenging as a way to  utilize the "challenge" C in C-C-C. Group utilized Administrator, Civil Service questions" as a way to introduce challenges and reframe distorted thinking. Group members worked through pt examples to practice challenging distorted thinking.  12:50 -1:00 Clinician led check-out. Clinician assessed for immediate needs, medication compliance and efficacy, and safety concerns  Therapist Response:12:00 - 12:50: Pt engaged in discussion and demonstrates understanding of challenging distorted thoughts through practice.  12:50 - 1:00: At check-out, patientrates his mood at [facility] a scale of 1-10 with 10 being great.Pt reports  afternoon plans of attending a PT appt. Pt demonstrates some progress as evidenced by work on building healthy habits. Patient denies SI/HI/self-harm at the end of group.    Suicidal/Homicidal: Nowithout  intent/plan  Plan: Pt will continue in PHP while working to decrease depression and anxiety symptoms, increase ability to manage symptoms in a healthy manner, and increase emotion regulation.  Diagnosis: GAD (generalized anxiety disorder) [F41.1]    1. GAD (generalized anxiety disorder)   2. Severe episode of recurrent major depressive disorder, without psychotic features (HCC)       Donia Guiles, LCSW 11/08/2020

## 2020-11-08 NOTE — Psych (Signed)
Virtual Visit via Video Note  I connected with Lula Olszewski on 10/27/20 at  9:00 AM EDT by a video enabled telemedicine application and verified that I am speaking with the correct person using two identifiers.  Location: Patient: patient home Provider: clinical home office   I discussed the limitations of evaluation and management by telemedicine and the availability of in person appointments. The patient expressed understanding and agreed to proceed.  I discussed the assessment and treatment plan with the patient. The patient was provided an opportunity to ask questions and all were answered. The patient agreed with the plan and demonstrated an understanding of the instructions.   The patient was advised to call back or seek an in-person evaluation if the symptoms worsen or if the condition fails to improve as anticipated.  Pt was provided 240 minutes of non-face-to-face time during this encounter.   Donia Guiles, LCSW    [facility] PHP THERAPIST PROGRESS NOTE  Jylan Loeza [id number]  Session Time: 9:00 - 10:00  Participation Level: Active  Behavioral Response: CasualAlertAnxious  Type of Therapy: Group Therapy  Treatment Goals addressed: Coping  Interventions: CBT, DBT, Supportive and Reframing  Summary:  Clinician led check-in regarding current stressors and situation, and review of patient completed daily inventory. Clinician utilized active listening and empathetic response and validated patient emotions. Clinician facilitated processing group on pertinent issues.   Therapist Response: Maleek Craver is a 54 y.o. male who presents with anxiety and depression symptoms. Patient arrived within time allowed and reports that he is feeling "pretty good." Patient rates hismood at a7.5on a scale of 1-10 with 10 being great. Pt reports he is tired and fatigued this morning after poor sleep d/t his sleep apnea. Pt reports spending time with family. Pt shares concern that he is  feeling better only because he is off work.  Pt able to process. Pt engaged in discussion.     Session Time: 10:00 - 11:00   Participation Level:Active  Behavioral Response:CasualAlertDepressed  Type of Therapy:GroupTherapy  Treatment Goals addressed: Coping  Interventions:CBT, DBT, Supportive and Reframing  Summary: Cln led discussion on impulsivity and how to manage it. Group members discussed struggles they experience with impulsivity and the consequences that have occurred. Cln explained the difference between impulsivity and unthoughtful decision making and how to create space between thought and action.  Therapist Response: Pt engaged in discussion and reports impulsivity is a struggle for him when upset.       Session Time: 11:00- 12:00  Participation Level:Active  Behavioral Response:CasualAlertDepressed  Type of Therapy: Group Therapy, OT  Treatment Goals addressed: Coping  Interventions:Psychosocial skills training, Supportive  Summary:Occupational Therapy group  Therapist Response:Patient engaged in group. See OT note.      Session Time: 12:00 -1:00  Participation Level:Active  Behavioral Response:CasualAlertDepressed  Type of Therapy:Group therapy  Treatment Goals addressed: Coping  Interventions:CBT; Solution focused; Supportive; Reframing  Summary:12:00 - 12:50:Cln introduced topic of CBT cognitive distortions. Cln discussed unhealthy thought patterns and how our thoughts shape our reality and irrational thoughts can alter our perspective. Cln utilized handout "cognitive distortions" to discuss common examples of distorted thoughts and group members worked to identify examples in their own life.  12:50 -1:00 Clinician led check-out. Clinician assessed for immediate needs, medication compliance and efficacy, and safety concerns  Therapist Response:12:00 - 12:50: Pt enaged in discussion and is  able to determine examples of distorted thinking in their own life.  12:50 - 1:00: At check-out, patientrates his mood at [facility] a  scale of 1-10 with 10 being great.Pt reports afternoon plans of practicing guitar. Pt demonstrates some progress as evidenced by increased effort into managing emotions. Patient denies SI/HI/self-harm at the end of group.    Suicidal/Homicidal: Nowithout intent/plan  Plan: Pt will continue in PHP while working to decrease depression and anxiety symptoms, increase ability to manage symptoms in a healthy manner, and increase emotion regulation.  Diagnosis: GAD (generalized anxiety disorder) [F41.1]    1. GAD (generalized anxiety disorder)   2. Severe episode of recurrent major depressive disorder, without psychotic features (HCC)       Donia Guiles, LCSW 11/08/2020

## 2020-11-08 NOTE — Progress Notes (Signed)
Virtual Visit via Video Note  I connected with Jim Smith on 11/08/20 at  9:00 AM EST by a video enabled telemedicine application and verified that I am speaking with the correct person using two identifiers.  At orientation to the IOP program, Case Manager discussed the limitations of evaluation and management by telemedicine and the availability of in person appointments. The patient expressed understanding and agreed to proceed with virtual visits throughout the duration of the program.   Location:  Patient: Patient Home Provider: Home Office   History of Present Illness: MDD and GAD  Observations/Objective: Check In: Case Manager checked in with all participants to review discharge dates, insurance authorizations, work-related documents and needs for the treatment team. Counselor facilitated a check-in with group members to assess mood and current functioning. Client reports that he was able to be productive and to connect with his family yesterday completing daily tasks. Client choosing to process information learned in group with family members. Client states that he continues to have anxiety about his return to work. Client feels confident that he has learned helpful skills, but doubts ability to apply in the moment. Client presents with mild depression and moderate anxiety. Client denied any current SI/HI/psychosis.   Initial Therapeutic Activity: Counselor provided psychoeducation on Mental Health advocacy in the workplace. Counselor role played various scenarios with group to practice communicating needs, setting healthy boundaries and influencing culture around employee mental health. Counselor provided group with a video highlighting skill to process visually.  Second Therapeutic Activity: Counselor prompted group members to journal about work related stressors to identify areas where they can achieve more work life balance. Group members shared issues aloud with the group and  received feedback/connected challenges. Counselor provided group with a video on 10 ways to reduce and better manage workplace stress. Counselor processed takeways with group. Client stated that they plan to implement a variety of the strategies.   Check Out: Counselor prompted group members to share their plans for implementing self-care or to engage in a productivity activity. Client plans to do yard work and enjoy Fall changes today to better manage mental health symptoms. Client indicated that they are not currently a risk to self of others before signing off.  Assessment and Plan: Clinician recommends that Client remain in IOP treatment to better manage mental health symptoms, stabilization and to address treatment plan goals. Clinician recommends adherence to crisis/safety plan, taking medications as prescribed, and following up with medical professionals if any issues arise.   Follow Up Instructions: Clinician will send Webex link for next session. The Client was advised to call back or seek an in-person evaluation if the symptoms worsen or if the condition fails to improve as anticipated.     I provided 180 minutes of non-face-to-face time during this encounter.     Hilbert Odor, LCSW

## 2020-11-08 NOTE — Psych (Signed)
Virtual Visit via Video Note  I connected with Jim Smith on 10/26/20 at  9:00 AM EDT by a video enabled telemedicine application and verified that I am speaking with the correct person using two identifiers.  Location: Patient: patient home Provider: clinical home office   I discussed the limitations of evaluation and management by telemedicine and the availability of in person appointments. The patient expressed understanding and agreed to proceed.  I discussed the assessment and treatment plan with the patient. The patient was provided an opportunity to ask questions and all were answered. The patient agreed with the plan and demonstrated an understanding of the instructions.   The patient was advised to call back or seek an in-person evaluation if the symptoms worsen or if the condition fails to improve as anticipated.  Pt was provided 240 minutes of non-face-to-face time during this encounter.   Donia Guiles, LCSW    [facility] PHP THERAPIST PROGRESS NOTE  Melford Tullier [id number]  Session Time: 9:00 - 10:00  Participation Level: Active  Behavioral Response: CasualAlertAnxious  Type of Therapy: Group Therapy  Treatment Goals addressed: Coping  Interventions: Supportive and Reframing  Summary: Chaplain A. Davee Lomax facilitated grief and loss group.  Therapist Response: Pt engaged in discussion.      Session Time: 10:00 - 11:00   Participation Level:Active  Behavioral Response:CasualAlertDepressed  Type of Therapy:GroupTherapy  Treatment Goals addressed: Coping  Interventions:CBT, DBT, Supportive and Reframing   Summary: Clinician led check-in regarding current stressors and situation, and review of patient completed daily inventory. Clinician utilized active listening and empathetic response and validated patient emotions. Clinician facilitated processing group on pertinent issues.   Therapist Response: Brenn Deziel is a 54 y.o. male who  presents with anxiety and depression symptoms. Patient arrived within time allowed and reports that he is feeling "okay." Patient rates hismood at [facility] a scale of 1-10 with 10 being great. Pt reports that he physically is not feeling well today after sleeping poorly. Pt reports he bought a guitar yesterday is hopeful it will be a new hobby for him. Pt struggles with negative thinking. Pt able to process. Pt engaged in discussion.     Session Time: 11:00- 12:00  Participation Level:Active  Behavioral Response:CasualAlertDepressed  Type of Therapy: Group Therapy  Treatment Goals addressed: Coping  Interventions: CBT, DBT, Supportive and Reframing  Summary:Cln introduced grounding techniques as a coping strategy. Cln utilized handout "Detaching from emotional pain" from [employer]. Group reviewed grounding strategies and how they can apply them to their every day life and in which situations.   Therapist Response: Pt engaged in discussion and is able to identify ways to utilize the techniques.      Session Time: 12:00 -1:00  Participation Level:Active  Behavioral Response:CasualAlertDepressed  Type of Therapy:Group therapy  Treatment Goals addressed: Coping  Interventions:Psychosocial skills training, Supportive  Summary:12:00 - 12:50:Occupational Therapy group 12:50 -1:00 Clinician led check-out. Clinician assessed for immediate needs, medication compliance and efficacy, and safety concerns  Therapist Response:12:00 - 12:50: Patient engaged in group. See OT note.   12:50 - 1:00: At check-out, patientrates his mood at [facility] a scale of 1-10 with 10 being great.Pt reports afternoon plans of taking a nap and trying to do chores. Pt demonstrates some progress as evidenced by making concrete steps to change. Patient denies SI/HI/self-harm at the end of group.    Suicidal/Homicidal: Nowithout intent/plan  Plan: Pt will continue in PHP while  working to decrease depression and anxiety symptoms, increase ability  to manage symptoms in a healthy manner, and increase emotion regulation.  Diagnosis: GAD (generalized anxiety disorder) [F41.1]    1. GAD (generalized anxiety disorder)   2. Severe episode of recurrent major depressive disorder, without psychotic features (HCC)       Donia Guiles, LCSW 11/08/2020

## 2020-11-08 NOTE — Progress Notes (Signed)
Virtual Visit via Video Note  I connected with Lula Olszewski on 11/08/20 at  9:00 AM EST by a video enabled telemedicine application and verified that I am speaking with the correct person using two identifiers.  At orientation to the IOP program, Case Manager discussed the limitations of evaluation and management by telemedicine and the availability of in person appointments. The patient expressed understanding and agreed to proceed with virtual visits throughout the duration of the program.   Location:  Patient: Patient Home Provider: Home Office   History of Present Illness: MDD and GAD  Observations/Objective: Check In: Case Manager checked in with all participants to review discharge dates, insurance authorizations, work-related documents and needs for the treatment team. Counselor facilitated a check-in with group members to assess mood and current functioning. Client reports that he is doing "alright" and was able to process further he deep topics covered in yesterday's session. Client reports being productive and stable, more able to relax than a week ago. Client presents with moderate depression and moderate anxiety. Client denied any current SI/HI/psychosis.   Initial Therapeutic Activity: Counselor presented psychoeducation on Adult Children of Alcoholics/Dysfunctional Families to the group. Counselor provided information on the family dynamics, roles, unspoken rules, impact into adulthood and interpersonally. Group members shared about their personal experiences within their family of origin and how they have been impacted today. Client shared about impact of parents choices on his life and how he functions/operates within his family of origin. Client connects anxiety and depression to genetics and environment.   Second Therapeutic Activity: Counselor provided information on healthy habits around sleep hygiene and the importance of sleep in relation to mental health and overall  well-being. Group members shared about their personal habits and identified areas they can improve and apply strategies.   Check Out: Counselor prompted group members to share their plans for implementing self-care or to engage in a productivity activity. Client plans to spend time outside and run errands today to better manage mental health symptoms. Client indicated that they are not currently a risk to self of others before signing off.  Assessment and Plan: Clinician recommends that Client remain in IOP treatment to better manage mental health symptoms, stabilization and to address treatment plan goals. Clinician recommends adherence to crisis/safety plan, taking medications as prescribed, and following up with medical professionals if any issues arise.   Follow Up Instructions: Clinician will send Webex link for next session. The Client was advised to call back or seek an in-person evaluation if the symptoms worsen or if the condition fails to improve as anticipated.     I provided 180 minutes of non-face-to-face time during this encounter.     Hilbert Odor, LCSW

## 2020-11-09 ENCOUNTER — Other Ambulatory Visit (HOSPITAL_COMMUNITY): Payer: Commercial Managed Care - PPO | Admitting: Licensed Clinical Social Worker

## 2020-11-09 ENCOUNTER — Other Ambulatory Visit: Payer: Self-pay

## 2020-11-09 DIAGNOSIS — F332 Major depressive disorder, recurrent severe without psychotic features: Secondary | ICD-10-CM

## 2020-11-09 DIAGNOSIS — F411 Generalized anxiety disorder: Secondary | ICD-10-CM

## 2020-11-09 NOTE — Progress Notes (Signed)
Virtual Visit via Video Note   I connected with Jim Smith on 11/09/20 at 9:00 AM EST by a video enabled telemedicine application and verified that I am speaking with the correct person using two identifiers.   Location: Patient: Patient Home Provider: OPT BH Office   Case Manager discussed the limitations of evaluation and management by telemedicine and the availability of in person appointments during orientation. The patient expressed understanding and agreed to proceed.   History of Present Illness: MDD and GAD   Observations/Objective: Case Manager checked in with all participants to review discharge dates, insurance authorizations, work-related documents and needs for the treatment team. Counselor facilitated a check-in with group members to gauge mood and current functioning as well as identify recent progress towards treatment goals.  Jim Smith presented to group session on time and was alert, oriented x5, with no evidence or self-report of current SI/HI or A/V H.  Jim Smith reported scores of 3/10 for depression and 5/10 for anxiety today.  Jim Smith reported that yesterday's session opened up some difficult feelings, so he struggled with dwelling upon the subject for awhile, which led to isolation and sleeping in.  He stated "I'm just hoping it was one of those days and today will be better".  Jim Smith introduced himself to new members of group and opened up about how he is an Art gallery manager by trade for 35 years, and has found it increasingly more difficult to deal with changes at work increasing stress, which influenced his need to take time off, learn to improve balance, and connect more with support network.  Counselor introduced Jim Smith, [place] Chaplain to present information and discussion on Grief and Loss. Group members engaged in discussion, sharing how grief impacts them, what comforts them, what emotions are felt, labeling losses, etc.  Jim Smith opened up about how 20 years ago he  experienced several losses within his family within quick succession, which created an "Emotional whirlwind", as he struggled to understand why this occurred, and how to deal with it.  Jim Smith reported that even today he has to be mindful of triggers that remind him of the deceased, and can influence mood fluctuations.    Counselor introduced topic of creating mental health maintenance plan today.  Counselor provided handout on subject to members, which stressed the importance of maintaining one's mental health in a similar way to using diet and exercise to ensure physical health.  Counselor walked members through process of identifying triggers which could worsen symptoms, including specific people, places, and things one needs to avoid.  Members were also tasked with identifying warning signs such as thoughts, feelings, or behaviors which could indicate mental health is at increased risk.  Counselor also facilitated conversation on self-care activities and coping strategies which members have previously utilized in the past, are currently using in daily routine, or plan to use soon to assist with managing problems or symptoms when/if they appear.  Counselor encouraged members to revisit their maintenance plan often and make changes as needed to ensure day to day stability.  Intervention was effective, as evidenced by Jim Smith reporting that his triggers include being around people that bully him, act aggressively or dismissive towards his needs, which can lead to feelings of anger and resentment.  Jim Smith reported that his warning signs include being in a work meeting and finding it difficult to concentrate upon what is being discussed, as well as experiencing increased dissatisfaction with his job, or feeling overwhelmed by day to day tasks.  He reported that  for self-care, he has begun to learn the guitar, engage in physical therapy, and watch his diet more closely.  Jim Smith reported that coping skills include  managing boundaries with triggering people, and opening up more to positive supports about how he feels and how to assist him.    Assessment and Plan: Counselor recommends that patient remains in IOP treatment to better manage mental health symptoms and continue to address treatment plan goals. Counselor recommends adherence to crisis/safety plan, taking medications as prescribed and following up with medical professionals if any issues arise.    Follow Up Instructions: Counselor will send Webex link for next session.  The patient was advised to call back or seek an in-person evaluation if the symptoms worsen or if the condition fails to improve as anticipated.   I provided 180 minutes of non-face-to-face time during this encounter.     Noralee Stain, LCSW, LCAS

## 2020-11-10 ENCOUNTER — Encounter (HOSPITAL_COMMUNITY): Payer: Self-pay | Admitting: Psychiatry

## 2020-11-10 ENCOUNTER — Other Ambulatory Visit (HOSPITAL_COMMUNITY): Payer: Commercial Managed Care - PPO | Admitting: Family

## 2020-11-10 ENCOUNTER — Other Ambulatory Visit: Payer: Self-pay

## 2020-11-10 DIAGNOSIS — F332 Major depressive disorder, recurrent severe without psychotic features: Secondary | ICD-10-CM | POA: Diagnosis not present

## 2020-11-10 DIAGNOSIS — F411 Generalized anxiety disorder: Secondary | ICD-10-CM

## 2020-11-10 NOTE — Progress Notes (Signed)
Virtual Visit via Video Note  I connected with Jim Smith on @TODAY @ at  9:00 AM EST by a video enabled telemedicine application and verified that I am speaking with the correct person using two identifiers.  Location: Patient: at home Provider: at the office   I discussed the limitations of evaluation and management by telemedicine and the availability of in person appointments. The patient expressed understanding and agreed to proceed.  I discussed the assessment and treatment plan with the patient. The patient was provided an opportunity to ask questions and all were answered. The patient agreed with the plan and demonstrated an understanding of the instructions.   The patient was advised to call back or seek an in-person evaluation if the symptoms worsen or if the condition fails to improve as anticipated.  I provided 20 minutes of non-face-to-face time during this encounter.   Patient ID: Jim Smith, male   DOB: 29-May-1966, 53 y.o.   MRN: [id number] D: As per previous CCA states:  Pt presents as referral for PHP from his PCP due to increasing anxiety and depression. Pt reports work is his main stressor and he struggles to detach from work issues once home. Pt reports struggles with isolating, interpersonal dynamics, and becoming easily frustrated and that it is affecting his daily functioning. Pt states MH struggles on his paternal line and that his great-grandfather and grandfather died by suicide and his father has alcohol and anger problems. Pt vehemently denies SI however is concerned that his genetics are affecting him. Pt struggles with accepting depression symptoms due to this family history. 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 Pt denies AVH and substance use concerns. Patient's Currently Reported Symptoms/Problems: Pt reports high anxiety, rumination, isolation, difficulty connecting with others, depressed mood, irritability,  tearfulness, and sleep disturbances.  Patient transitioned from [facility] to MH-IOP today.  States he learned a lot of coping skills in PHP and is looking forward to learning more.  Pt reports that he is very anxious about returning to work next week.  Denies SI/HI or A/V hallucinations.  On a scale of 1-10 (10 being the worst); pt rated his anxiety and depression #3's.  Pt completed all five days in MH-IOP.  Reports overall mood improved, but is very anxious about returning to work next week.  Pt is inquiring if he will need a RTW note or not.  Pt states he is struggling with anger still; he plans to attend an anger mgmt group.  On a scale of 1-10 (10 being the worst); pt rates his depression #1 and anxiety #8. States the groups were very helpful with learning some skills. A:  D/C pt today.  Redirected pt to his HR Dept to inquire if he will need a RTW note or not.  Strongly recommended groups at [facility].  Discussed follow up.  Pt prefers to explore options re: therapist and his PCP vs psychiatrist.  Mentioned to patient that he could go through his insurance company also for assistance.  Pt is scheduled to RTW next week without any restrictions. R:  Patient receptive.  [place], M.Ed,CNA

## 2020-11-10 NOTE — Patient Instructions (Signed)
D:  Patient completed MH-IOP today.  A:  Discharge today.  Follow up with PCP and therapist of choice (practice names were given).  Encouraged support groups.  Return back to work next week without any restrictions.  R:  Patient receptive.

## 2020-11-10 NOTE — Progress Notes (Signed)
Virtual Visit via Video Note  I connected with Jim Smith on 11/10/20 at  9:00 AM EST by a video enabled telemedicine application and verified that I am speaking with the correct person using two identifiers.  At orientation to the IOP program, Case Manager discussed the limitations of evaluation and management by telemedicine and the availability of in person appointments. The patient expressed understanding and agreed to proceed with virtual visits throughout the duration of the program.   Location:  Patient: Patient Home Provider: Home Office   History of Present Illness: MDD and GAD  Observations/Objective: Check In: Case Manager checked in with all participants to review discharge dates, insurance authorizations, work-related documents and needs for the treatment team. Counselor facilitated a check-in with group members to assess mood and current functioning. Client reports that he is considering adding yoga to his self-care routine to relax and as a healthy physical outlet. Client continues to doubt ability to cope after treatment when tested on the job. Group provided reassurance of abilities.  Client presents with mild depression and moderate anxiety. Client denied any current SI/HI/psychosis.   Initial Therapeutic Activity: Counselor provided information on the programs and services of [facility] to inform group on access to ongoing care after treatment programs have ended. Counselor shared about peer support, support groups, [facility] and [employer]. Client stated that they are most interested in the Anger Management Class.  Second Therapeutic Activity: Counselor prompted group to reflect and document how their depression and anxiety differ and are the same. Client noted that his are more triggered by situations than a chronic issue. Counselor introduced and played a video by Stevan Born, LMFT discussing the impact of avoidance in causing depression  and anxiety to grow, along with strategies to combat avoidance. Client noted that he feels compelled to apologize to his family for the ways he has neglected to engage due to lack of work life balance in the past. Client felt video was directly connected to his mental health experience.  Check Out: Counselor prompted group members to share their plans for implementing self-care or to engage in a productivity activity. Client plans to attend an event with his daughter manage mental health symptoms over the weekend. Counselor closed program by allowing time to celebrate the client as a graduating group member. Counselor shared reflections on progress and allow space for group members to share well wishes and appreciates to the graduating client. Counselor prompted graduating client to share takeaways, reflect on progress and final thoughts for the group. Client indicated that they are not currently a risk to self of others before signing off.  Assessment and Plan: Clinician recommends that Client step down from IOP program to medication management and individual therapy. Clinician recommends adherence to crisis/safety plan, taking medications as prescribed, and following up with medical professionals if any issues arise.   Follow Up Instructions: Clinician will send Webex link for next session. The Client was advised to call back or seek an in-person evaluation if the symptoms worsen or if the condition fails to improve as anticipated.     I provided 180 minutes of non-face-to-face time during this encounter.     Hilbert Odor, LCSW

## 2020-11-11 NOTE — Progress Notes (Signed)
Virtual Visit via Video Note  I connected with Lula Olszewski on 11/11/20 at  9:00 AM EST by a video enabled telemedicine application and verified that I am speaking with the correct person using two identifiers.  Location: Patient: home Provider: home   I discussed the limitations of evaluation and management by telemedicine and the availability of in person appointments. The patient expressed understanding and agreed to proceed.   I discussed the assessment and treatment plan with the patient. The patient was provided an opportunity to ask questions and all were answered. The patient agreed with the plan and demonstrated an understanding of the instructions.   The patient was advised to call back or seek an in-person evaluation if the symptoms worsen or if the condition fails to improve as anticipated.  I provided 15 minutes of non-face-to-face time during this encounter.   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 Stated that he has beenunable to let situation go states he ruminates on problems constantly. States this is starting to affect his marriage as he just comes home to "rant"about workplace stressors. Patient reports he feels frustrated and continues to do well on every day situation.  Chemical Use History: denied   Progress in Program Toward Treatment Goals: Ongoing, patient attended and participated with daily group session with active and engaged participation.  Denying suicidal or homicidal ideations.  Denies auditory or visual hallucinations.   Reports ongoing struggles with anxiety and intermittent sleep disruption.  Declined medication refills at this time.  Patient recently completed partial hospitalization program support, encouragement and reassurance was provided.  Progress (rationale): Keep follow-up appointment with primary care provider and therapist.  Take all medications as prescribed. Keep all follow-up appointments as scheduled.  Do not consume alcohol or use illegal drugs while on prescription medications. Report any adverse effects from your medications to your primary care provider promptly.  In the event of recurrent symptoms or worsening symptoms, call 911, a crisis hotline, or go to the nearest emergency department for evaluation.    Oneta Rack, NP 11/11/2020

## 2020-11-20 NOTE — Psych (Signed)
Virtual Visit via Video Note  I connected with Jim Smith on 10/31/20 at  9:00 AM EDT by a video enabled telemedicine application and verified that I am speaking with the correct person using two identifiers.  Location: Patient: patient home Provider: clinical home office   I discussed the limitations of evaluation and management by telemedicine and the availability of in person appointments. The patient expressed understanding and agreed to proceed.  I discussed the assessment and treatment plan with the patient. The patient was provided an opportunity to ask questions and all were answered. The patient agreed with the plan and demonstrated an understanding of the instructions.   The patient was advised to call back or seek an in-person evaluation if the symptoms worsen or if the condition fails to improve as anticipated.  Pt was provided 240 minutes of non-face-to-face time during this encounter.   Donia Guiles, LCSW    [facility] PHP THERAPIST PROGRESS NOTE  Jim Smith [id number]  Session Time: 9:00 - 10:00  Participation Level: Active  Behavioral Response: CasualAlertAnxious  Type of Therapy: Group Therapy  Treatment Goals addressed: Coping  Interventions: CBT, DBT, Supportive and Reframing  Summary:  Clinician led check-in regarding current stressors and situation, and review of patient completed daily inventory. Clinician utilized active listening and empathetic response and validated patient emotions. Clinician facilitated processing group on pertinent issues.   Therapist Response: Jim Smith is a 54 y.o. male who presents with anxiety and depression symptoms. Patient arrived within time allowed and reports that he is feeling "fairly good." Patient rates hismood at [facility] a scale of 1-10 with 10 being great. Pt reports he slept well and is in a stable emotional place. Pt reports optimism regarding his sleep procedure tomorrow. Pt able to process. Pt engaged in  discussion.     Session Time: 10:00 - 11:00   Participation Level:Active  Behavioral Response:CasualAlertDepressed  Type of Therapy:GroupTherapy  Treatment Goals addressed: Coping  Interventions:CBT, DBT, Supportive and Reframing  Summary: Cln led discussion on planning ahead as a way to mitigate anxiety. Group members shared worries about keeping up good habits when returning to "normal" life post-treatment. Group  able to brainstorm ways to build habits now and how they can fit into their life after treatment.   Therapist Response: Pt engaged in discussion and identifies ways to plan ahead for anxieties.       Session Time: 11:00- 12:00  Participation Level:Active  Behavioral Response:CasualAlertDepressed  Type of Therapy: Group Therapy, OT  Treatment Goals addressed: Coping  Interventions:Psychosocial skills training, Supportive  Summary:Occupational Therapy group  Therapist Response:Patient engaged in group. See OT note.      Session Time: 12:00 -1:00  Participation Level:Active  Behavioral Response:CasualAlertDepressed  Type of Therapy:Group therapy  Treatment Goals addressed: Coping  Interventions:CBT; Solution focused; Supportive; Reframing  Summary:12:00 - 12:50: Cln introduced topic of DBT distress tolerance skills. Cln provided context for distress tolerance skills and how to practice them. Cln introduced the ACCEPTS distraction skills and group discusses ways to utilize "A" activities.  12:50 -1:00 Clinician led check-out. Clinician assessed for immediate needs, medication compliance and efficacy, and safety concerns  Therapist Response:12:00 - 12:50: Pt engaged in discussion and states they can read and watch tv to practice the "A" skill.  12:50 - 1:00: At check-out, patientrates his mood at [facility] a scale of 1-10 with 10 being great.Pt reports afternoon plans of playing guitar. Pt  demonstrates some progress as evidenced by increased positive outlook. Patient denies SI/HI/self-harm at the  end of group.    Suicidal/Homicidal: Nowithout intent/plan  Plan: Pt will continue in PHP while working to decrease depression and anxiety symptoms, increase ability to manage symptoms in a healthy manner, and increase emotion regulation.  Diagnosis: GAD (generalized anxiety disorder) [F41.1]    1. GAD (generalized anxiety disorder)   2. Severe episode of recurrent major depressive disorder, without psychotic features (HCC)       Donia Guiles, LCSW 11/20/2020

## 2020-11-20 NOTE — Psych (Signed)
Virtual Visit via Video Note  I connected with Jim Smith on 11/02/20 at  9:00 AM EDT by a video enabled telemedicine application and verified that I am speaking with the correct person using two identifiers.  Location: Patient: patient home Provider: clinical home office   I discussed the limitations of evaluation and management by telemedicine and the availability of in person appointments. The patient expressed understanding and agreed to proceed.  I discussed the assessment and treatment plan with the patient. The patient was provided an opportunity to ask questions and all were answered. The patient agreed with the plan and demonstrated an understanding of the instructions.   The patient was advised to call back or seek an in-person evaluation if the symptoms worsen or if the condition fails to improve as anticipated.  Pt was provided 240 minutes of non-face-to-face time during this encounter.   Donia Guiles, LCSW    [facility] PHP THERAPIST PROGRESS NOTE  Jim Smith [id number]  Session Time: 9:00 - 10:00  Participation Level: Active  Behavioral Response: CasualAlertAnxious  Type of Therapy: Group Therapy  Treatment Goals addressed: Coping  Interventions: CBT, DBT, Supportive and Reframing  Summary:  Clinician led check-in regarding current stressors and situation, and review of patient completed daily inventory. Clinician utilized active listening and empathetic response and validated patient emotions. Clinician facilitated processing group on pertinent issues.   Therapist Response: Jim Smith is a 54 y.o. male who presents with anxiety and depression symptoms. Patient arrived within time allowed and reports that he is feeling "good." Patient rates hismood at a8.5on a scale of 1-10 with 10 being great. Pt reports his procedure went well and he qualifies for a sleep apnea surgery which he is hopeful about. Pt states he napped throughout the day which interfered  with his normal sleep patterns. Pt able to process. Pt engaged in discussion.     Session Time: 10:00 - 11:00   Participation Level:Active  Behavioral Response:CasualAlertDepressed  Type of Therapy:GroupTherapy  Treatment Goals addressed: Coping  Interventions:CBT, DBT, Supportive and Reframing  Summary: Cln led discussion on emotional reasoning and provided context in terms of CBT unhealthy thought patterns. Cln encouraged pt's to be more specific in their speech and thought dialogues to highlight the presence of a feeling, a mutable and time limited experience. Group members were encouraged to use reminder: "feelings do not equal fact."  Therapist Response:Pt engaged in discussion and is able to identify patterns in which they struggle to differentiate feelings and fact.     Session Time: 11:00- 12:00  Participation Level:Active  Behavioral Response:CasualAlertDepressed  Type of Therapy: Group Therapy, OT  Treatment Goals addressed: Coping  Interventions:Psychosocial skills training, Supportive  Summary:Occupational Therapy group  Therapist Response:Patient engaged in group. See OT note.      Session Time: 12:00 -1:00  Participation Level:Active  Behavioral Response:CasualAlertDepressed  Type of Therapy:Group therapy  Treatment Goals addressed: Coping  Interventions:CBT; Solution focused; Supportive; Reframing  Summary:12:00 - 12:50:Cln continued topic of DBT distress tolerance skills and the ACCEPTS distraction skill. Group reviewed P-T-S skills and discussed how they can practice them in their every day life.practice challenging distorted thinking.  12:50 -1:00 Clinician led check-out. Clinician assessed for immediate needs, medication compliance and efficacy, and safety concerns  Therapist Response:12:00 - 12:50:Pt engaged in discussion and reports he is most likely to practice the "T" skill.   12:50 - 1:00: At check-out, patientrates his mood at [facility] a scale of 1-10 with 10 being great.Pt reports afternoon plans of taking  a drive. Pt demonstrates some progress as evidenced by increased use of distractions. Patient denies SI/HI/self-harm at the end of group.    Suicidal/Homicidal: Nowithout intent/plan  Plan: Pt will continue in PHP while working to decrease depression and anxiety symptoms, increase ability to manage symptoms in a healthy manner, and increase emotion regulation.  Diagnosis: GAD (generalized anxiety disorder) [F41.1]    1. GAD (generalized anxiety disorder)   2. Severe episode of recurrent major depressive disorder, without psychotic features (HCC)       Donia Guiles, LCSW 11/20/2020

## 2020-11-20 NOTE — Psych (Signed)
Virtual Visit via Video Note  I connected with Jim Smith on 11/03/20 at  9:00 AM EDT by a video enabled telemedicine application and verified that I am speaking with the correct person using two identifiers.  Location: Patient: patient home Provider: clinical home office   I discussed the limitations of evaluation and management by telemedicine and the availability of in person appointments. The patient expressed understanding and agreed to proceed.  I discussed the assessment and treatment plan with the patient. The patient was provided an opportunity to ask questions and all were answered. The patient agreed with the plan and demonstrated an understanding of the instructions.   The patient was advised to call back or seek an in-person evaluation if the symptoms worsen or if the condition fails to improve as anticipated.  Pt was provided 240 minutes of non-face-to-face time during this encounter.   Donia Guiles, LCSW    [facility] PHP THERAPIST PROGRESS NOTE  Jim Smith [id number]  Session Time: 9:00 - 10:00  Participation Level: Active  Behavioral Response: CasualAlertAnxious  Type of Therapy: Group Therapy  Treatment Goals addressed: Coping  Interventions: CBT, DBT, Supportive and Reframing  Summary:  Clinician led check-in regarding current stressors and situation, and review of patient completed daily inventory. Clinician utilized active listening and empathetic response and validated patient emotions. Clinician facilitated processing group on pertinent issues.   Therapist Response: Jim Smith is a 54 y.o. male who presents with anxiety and depression symptoms. Patient arrived within time allowed and reports that he is feeling "good." Patient rates hismood at [facility] a scale of 1-10 with 10 being great. Pt reports he left the house yesterday and focused on being out. Pt reports poor sleep last night, getting about 4 hours. Pt reports struggle with ruminating about  work. Pt able to process. Pt engaged in discussion.     Session Time: 10:00 - 11:00   Participation Level:Active  Behavioral Response:CasualAlertDepressed  Type of Therapy:GroupTherapy  Treatment Goals addressed: Coping  Interventions:CBT, DBT, Supportive and Reframing  Summary: Cln led discussion on impulsivity. Group discussed struggles with impulsivity. Cln highlighted theme of immediacy. Group built insight around the way immediacy interatcs with impulsivity. Cln encouraged pt's to consider mantras and grounding statements to remind themselves there is time to think/feel/act.   Therapist Response:Pt engaged in discussion and is able to make connections and gain insight.      Session Time: 11:00- 12:00  Participation Level:Active  Behavioral Response:CasualAlertDepressed  Type of Therapy: Group Therapy, OT  Treatment Goals addressed: Coping  Interventions:Psychosocial skills training, Supportive  Summary:Occupational Therapy group  Therapist Response:Patient engaged in group. See OT note.      Session Time: 12:00 -1:00  Participation Level:Active  Behavioral Response:CasualAlertDepressed  Type of Therapy:Group therapy  Treatment Goals addressed: Coping  Interventions:CBT; Solution focused; Supportive; Reframing  Summary:12:00 - 12:50:Cln introduced topic of positive psychology.  Group viewed TED talk "The Happiness Advantage" to aid discussion. Group discussed the 5 ways to train your brain to scan for the positive: conscious acts of kindness, meditation, exercise, postive event journaling, and gratitudes. Group members discussed how to apply the principles in their every day life.  12:50 -1:00 Clinician led check-out. Clinician assessed for immediate needs, medication compliance and efficacy, and safety concerns  Therapist Response:12:00 - 12:50:Pt engaged in discussion and reports he can start by  utilizing conscious acts of kindness.  12:50 - 1:00: At check-out, patientrates his mood at [facility] a scale of 1-10 with 10 being great.Pt reports afternoon  plans of spending time with his daughter. Pt demonstrates some progress as evidenced by putting effort into his hobby. Patient denies SI/HI/self-harm at the end of group.    Suicidal/Homicidal: Nowithout intent/plan  Plan: Pt will continue in PHP while working to decrease depression and anxiety symptoms, increase ability to manage symptoms in a healthy manner, and increase emotion regulation.  Diagnosis: Severe episode of recurrent major depressive disorder, without psychotic features (HCC) [F33.2]    1. Severe episode of recurrent major depressive disorder, without psychotic features (HCC)   2. GAD (generalized anxiety disorder)       Donia Guiles, LCSW 11/20/2020

## 2021-01-26 ENCOUNTER — Other Ambulatory Visit (HOSPITAL_BASED_OUTPATIENT_CLINIC_OR_DEPARTMENT_OTHER): Payer: Self-pay

## 2021-01-26 DIAGNOSIS — G4733 Obstructive sleep apnea (adult) (pediatric): Secondary | ICD-10-CM

## 2021-02-23 ENCOUNTER — Ambulatory Visit (HOSPITAL_BASED_OUTPATIENT_CLINIC_OR_DEPARTMENT_OTHER): Payer: Commercial Managed Care - PPO | Admitting: Internal Medicine

## 2021-02-27 ENCOUNTER — Ambulatory Visit (HOSPITAL_BASED_OUTPATIENT_CLINIC_OR_DEPARTMENT_OTHER): Payer: Commercial Managed Care - PPO | Attending: Otolaryngology | Admitting: Internal Medicine

## 2021-02-27 ENCOUNTER — Other Ambulatory Visit: Payer: Self-pay

## 2021-02-27 VITALS — Ht 74.0 in | Wt 208.0 lb

## 2021-02-27 DIAGNOSIS — G4733 Obstructive sleep apnea (adult) (pediatric): Secondary | ICD-10-CM

## 2021-05-15 ENCOUNTER — Other Ambulatory Visit (HOSPITAL_BASED_OUTPATIENT_CLINIC_OR_DEPARTMENT_OTHER): Payer: Self-pay

## 2021-05-15 DIAGNOSIS — G4733 Obstructive sleep apnea (adult) (pediatric): Secondary | ICD-10-CM

## 2021-05-23 ENCOUNTER — Other Ambulatory Visit: Payer: Self-pay

## 2021-05-23 ENCOUNTER — Ambulatory Visit (HOSPITAL_BASED_OUTPATIENT_CLINIC_OR_DEPARTMENT_OTHER): Payer: Commercial Managed Care - PPO | Attending: Otolaryngology | Admitting: Internal Medicine

## 2021-05-23 VITALS — Ht 74.0 in | Wt 208.0 lb

## 2021-05-23 DIAGNOSIS — G4733 Obstructive sleep apnea (adult) (pediatric): Secondary | ICD-10-CM | POA: Diagnosis not present

## 2021-06-02 DIAGNOSIS — G4733 Obstructive sleep apnea (adult) (pediatric): Secondary | ICD-10-CM

## 2021-06-02 NOTE — Procedures (Signed)
Patient Name: Jim Smith, Sitar Date: 05/23/2021 Gender: Male D.O.B: [birth year] Age (years): 54 Referring Provider: Melida Quitter Height (inches): 78 Interpreting Physician: Baird Lyons MD, ABSM Weight (lbs): 208 RPSGT: Laren Everts BMI: 27 MRN: [id number] Neck Size: 15.75  CLINICAL INFORMATION Sleep Study Type: Split Night CPAP Indication for sleep study: Excessive Daytime Sleepiness, Fatigue, Morning Headaches, OSA, Snoring Epworth Sleepiness Score: 7  SLEEP STUDY TECHNIQUE As per the AASM Manual for the Scoring of Sleep and Associated Events v2.3 (April 2016) with a hypopnea requiring 4% desaturations.  The channels recorded and monitored were frontal, central and occipital EEG, electrooculogram (EOG), submentalis EMG (chin), nasal and oral airflow, thoracic and abdominal wall motion, anterior tibialis EMG, snore microphone, electrocardiogram, and pulse oximetry. Continuous positive airway pressure (CPAP) was initiated when the patient met split night criteria and was titrated according to treat sleep-disordered breathing.  MEDICATIONS Medications self-administered by patient taken the night of the study : none reported  RESPIRATORY PARAMETERS Diagnostic  Total AHI (/hr): 18.5 RDI (/hr): 27.5 OA Index (/hr): - CA Index (/hr): 0.0 REM AHI (/hr): 55.0 NREM AHI (/hr): 10.0 Supine AHI (/hr): 50.9 Non-supine AHI (/hr): 0 Min O2 Sat (%): 82.0 Mean O2 (%): 94.9 Time below 88% (min): 1.5   Titration  Optimal Pressure (cm): 11 AHI at Optimal Pressure (/hr): 0 Min O2 at Optimal Pressure (%): 96.0 Supine % at Optimal (%): 100 Sleep % at Optimal (%): 86   SLEEP ARCHITECTURE The recording time for the entire night was 396.1 minutes.  During a baseline period of 144.9 minutes, the patient slept for 126.5 minutes in REM and nonREM, yielding a sleep efficiency of 87.3%%. Sleep onset after lights out was 1.3 minutes with a REM latency of 110.0 minutes. The patient spent  16.2%% of the night in stage N1 sleep, 64.8%% in stage N2 sleep, 0.0%% in stage N3 and 19% in REM.  During the titration period of 245.4 minutes, the patient slept for 232.0 minutes in REM and nonREM, yielding a sleep efficiency of 94.5%%. Sleep onset after CPAP initiation was 3.6 minutes with a REM latency of 3.5 minutes. The patient spent 7.8%% of the night in stage N1 sleep, 47.4%% in stage N2 sleep, 0.0%% in stage N3 and 44.8% in REM.  CARDIAC DATA The 2 lead EKG demonstrated sinus rhythm. The mean heart rate was 58.3 beats per minute. Other EKG findings include: None.  LEG MOVEMENT DATA The total Periodic Limb Movements of Sleep (PLMS) were 0. The PLMS index was 0.0 .  IMPRESSIONS - Moderate obstructive sleep apnea occurred during the diagnostic portion of the study(AHI = 18.5/hour). An optimal PAP pressure was selected for this patient ( 11 cm of water) - No significant central sleep apnea occurred during the diagnostic portion of the study (CAI = 0.0/hour). - The patient had minimal or no oxygen desaturation during the diagnostic portion of the study (Min O2 = 82.0%) Minimum O2 saturation on CPAP 11 was 96%. - The patient snored with moderate snoring volume during the diagnostic portion of the study. - No cardiac abnormalities were noted during this study. - Total Limb Movements 249. Limb movements with arousal 9 (4.3/ hr).  DIAGNOSIS - Obstructive Sleep Apnea (G47.33)  RECOMMENDATIONS - Trial of CPAP therapy on 11 cm H2O or autopap 5-15. Other options would be based on clinical judgment. - Patient used a Medium size Resmed Full Face Mask AirFit F30 mask and heated humidification. - Be careful with alcohol, sedatives and other CNS  depressants that may worsen sleep apnea and disrupt normal sleep architecture. - Sleep hygiene should be reviewed to assess factors that may improve sleep quality. - Weight management and regular exercise should be initiated or continued.  [Electronically  signed] 06/02/2021 11:57 AM  Baird Lyons MD, ABSM Diplomate, American Board of Sleep Medicine   NPI: [id number]                        [place] [place] [facility]  ELECTRONICALLY SIGNED ON:  06/02/2021, 11:52 AM [place] PH: [phone]   FX: [phone] [place]

## 2021-06-12 ENCOUNTER — Other Ambulatory Visit: Payer: Self-pay | Admitting: Otolaryngology

## 2021-06-19 ENCOUNTER — Other Ambulatory Visit: Payer: Self-pay

## 2021-06-19 ENCOUNTER — Encounter (HOSPITAL_BASED_OUTPATIENT_CLINIC_OR_DEPARTMENT_OTHER): Payer: Self-pay | Admitting: Otolaryngology

## 2021-07-04 ENCOUNTER — Ambulatory Visit (HOSPITAL_COMMUNITY): Payer: Commercial Managed Care - PPO

## 2021-07-04 ENCOUNTER — Other Ambulatory Visit: Payer: Self-pay

## 2021-07-04 ENCOUNTER — Ambulatory Visit (HOSPITAL_BASED_OUTPATIENT_CLINIC_OR_DEPARTMENT_OTHER): Payer: Commercial Managed Care - PPO | Admitting: Certified Registered"

## 2021-07-04 ENCOUNTER — Ambulatory Visit (HOSPITAL_BASED_OUTPATIENT_CLINIC_OR_DEPARTMENT_OTHER)
Admission: RE | Admit: 2021-07-04 | Discharge: 2021-07-04 | Disposition: A | Discharge status: Home / Self Care | Payer: Commercial Managed Care - PPO | Attending: Otolaryngology | Admitting: Otolaryngology

## 2021-07-04 ENCOUNTER — Encounter (HOSPITAL_BASED_OUTPATIENT_CLINIC_OR_DEPARTMENT_OTHER)
Admission: RE | Disposition: A | Discharge status: Home / Self Care | Payer: Self-pay | Source: Home / Self Care | Attending: Otolaryngology

## 2021-07-04 ENCOUNTER — Encounter (HOSPITAL_BASED_OUTPATIENT_CLINIC_OR_DEPARTMENT_OTHER): Payer: Self-pay | Admitting: Otolaryngology

## 2021-07-04 DIAGNOSIS — Z880 Allergy status to penicillin: Secondary | ICD-10-CM | POA: Diagnosis not present

## 2021-07-04 DIAGNOSIS — Z79899 Other long term (current) drug therapy: Secondary | ICD-10-CM | POA: Insufficient documentation

## 2021-07-04 DIAGNOSIS — G4733 Obstructive sleep apnea (adult) (pediatric): Secondary | ICD-10-CM

## 2021-07-04 DIAGNOSIS — Z800 Family history of malignant neoplasm of digestive organs: Secondary | ICD-10-CM | POA: Insufficient documentation

## 2021-07-04 DIAGNOSIS — Z820 Family history of epilepsy and other diseases of the nervous system: Secondary | ICD-10-CM | POA: Insufficient documentation

## 2021-07-04 DIAGNOSIS — I10 Essential (primary) hypertension: Secondary | ICD-10-CM | POA: Diagnosis not present

## 2021-07-04 HISTORY — PX: IMPLANTATION OF HYPOGLOSSAL NERVE STIMULATOR: SHX6827

## 2021-07-04 SURGERY — INSERTION, HYPOGLOSSAL NERVE STIMULATOR
Anesthesia: General | Site: Chest | Laterality: Right

## 2021-07-04 MED ORDER — SUCCINYLCHOLINE CHLORIDE 20 MG/ML IJ SOLN
INTRAMUSCULAR | Status: DC | PRN
  Administered 2021-07-04: 100 mg via INTRAVENOUS

## 2021-07-04 MED ORDER — DEXMEDETOMIDINE (PRECEDEX) IN NS 20 MCG/5ML (4 MCG/ML) IV SYRINGE
PREFILLED_SYRINGE | INTRAVENOUS | Status: DC | PRN
  Administered 2021-07-04: 8 ug via INTRAVENOUS

## 2021-07-04 MED ORDER — HYDROMORPHONE HCL 1 MG/ML IJ SOLN
0.2500 mg | INTRAMUSCULAR | Status: DC | PRN
  Administered 2021-07-04: 0.25 mg via INTRAVENOUS

## 2021-07-04 MED ORDER — LACTATED RINGERS IV SOLN: INTRAVENOUS | Status: DC

## 2021-07-04 MED ORDER — CLINDAMYCIN PHOSPHATE 900 MG/50ML IV SOLN
900.0000 mg | INTRAVENOUS | Status: AC
  Administered 2021-07-04: 900 mg via INTRAVENOUS

## 2021-07-04 MED ORDER — PROPOFOL 10 MG/ML IV BOLUS
INTRAVENOUS | Status: AC
  Filled 2021-07-04: qty 20

## 2021-07-04 MED ORDER — DEXAMETHASONE SODIUM PHOSPHATE 4 MG/ML IJ SOLN
INTRAMUSCULAR | Status: DC | PRN
  Administered 2021-07-04: 5 mg via INTRAVENOUS

## 2021-07-04 MED ORDER — HYDROMORPHONE HCL 1 MG/ML IJ SOLN
INTRAMUSCULAR | Status: AC
  Filled 2021-07-04: qty 0.5

## 2021-07-04 MED ORDER — CLINDAMYCIN PHOSPHATE 900 MG/50ML IV SOLN
INTRAVENOUS | Status: AC
  Filled 2021-07-04: qty 50

## 2021-07-04 MED ORDER — CHLORHEXIDINE GLUCONATE CLOTH 2 % EX PADS: 6.0000 | MEDICATED_PAD | Freq: Once | CUTANEOUS | Status: DC

## 2021-07-04 MED ORDER — PROPOFOL 10 MG/ML IV BOLUS
INTRAVENOUS | Status: DC | PRN
  Administered 2021-07-04: 200 mg via INTRAVENOUS

## 2021-07-04 MED ORDER — FENTANYL CITRATE (PF) 100 MCG/2ML IJ SOLN
INTRAMUSCULAR | Status: AC
  Filled 2021-07-04: qty 2

## 2021-07-04 MED ORDER — AMISULPRIDE (ANTIEMETIC) 5 MG/2ML IV SOLN: 10.0000 mg | Freq: Once | INTRAVENOUS | Status: DC | PRN

## 2021-07-04 MED ORDER — MIDAZOLAM HCL 5 MG/5ML IJ SOLN
INTRAMUSCULAR | Status: DC | PRN
  Administered 2021-07-04: 2 mg via INTRAVENOUS

## 2021-07-04 MED ORDER — GENTAMICIN SULFATE 40 MG/ML IJ SOLN: 5.0000 mg/kg | INTRAVENOUS | Status: DC

## 2021-07-04 MED ORDER — OXYCODONE HCL 5 MG PO TABS
5.0000 mg | ORAL_TABLET | Freq: Once | ORAL | Status: AC | PRN
Start: 2021-07-04 — End: 2021-07-04
  Administered 2021-07-04: 5 mg via ORAL

## 2021-07-04 MED ORDER — HYDROCODONE-ACETAMINOPHEN 5-325 MG PO TABS: 1.0000 | ORAL_TABLET | Freq: Four times a day (QID) | ORAL | 0 refills | Status: AC | PRN

## 2021-07-04 MED ORDER — MIDAZOLAM HCL 2 MG/2ML IJ SOLN
INTRAMUSCULAR | Status: AC
  Filled 2021-07-04: qty 2

## 2021-07-04 MED ORDER — LIDOCAINE-EPINEPHRINE 1 %-1:100000 IJ SOLN
INTRAMUSCULAR | Status: DC | PRN
  Administered 2021-07-04: 4 mL

## 2021-07-04 MED ORDER — OXYCODONE HCL 5 MG/5ML PO SOLN: 5.0000 mg | Freq: Once | ORAL | Status: AC | PRN

## 2021-07-04 MED ORDER — PROMETHAZINE HCL 25 MG/ML IJ SOLN: 6.2500 mg | INTRAMUSCULAR | Status: DC | PRN

## 2021-07-04 MED ORDER — MEPERIDINE HCL 25 MG/ML IJ SOLN: 6.2500 mg | INTRAMUSCULAR | Status: DC | PRN

## 2021-07-04 MED ORDER — PROPOFOL 500 MG/50ML IV EMUL
INTRAVENOUS | Status: DC | PRN
  Administered 2021-07-04: 75 ug/kg/min via INTRAVENOUS

## 2021-07-04 MED ORDER — 0.9 % SODIUM CHLORIDE (POUR BTL) OPTIME
TOPICAL | Status: DC | PRN
  Administered 2021-07-04: 200 mL

## 2021-07-04 MED ORDER — FENTANYL CITRATE (PF) 100 MCG/2ML IJ SOLN
INTRAMUSCULAR | Status: DC | PRN
  Administered 2021-07-04 (×2): 50 ug via INTRAVENOUS

## 2021-07-04 MED ORDER — OXYCODONE HCL 5 MG PO TABS
ORAL_TABLET | ORAL | Status: AC
  Filled 2021-07-04: qty 1

## 2021-07-04 MED ORDER — LIDOCAINE 2% (20 MG/ML) 5 ML SYRINGE
INTRAMUSCULAR | Status: DC | PRN
  Administered 2021-07-04: 60 mg via INTRAVENOUS

## 2021-07-04 MED ORDER — LIDOCAINE-EPINEPHRINE 1 %-1:100000 IJ SOLN
INTRAMUSCULAR | Status: AC
  Filled 2021-07-04: qty 1

## 2021-07-04 SURGICAL SUPPLY — 69 items
ACC NRSTM 4 TRQ WRNCH STRL (MISCELLANEOUS)
ADH SKN CLS APL DERMABOND .7 (GAUZE/BANDAGES/DRESSINGS) ×2
BLADE CLIPPER SURG (BLADE) ×1 IMPLANT
BLADE SURG 15 STRL LF DISP TIS (BLADE) ×1 IMPLANT
BLADE SURG 15 STRL SS (BLADE) ×2
BNDG COHESIVE 2X5 TAN ST LF (GAUZE/BANDAGES/DRESSINGS) ×1 IMPLANT
CANISTER SUCT 1200ML W/VALVE (MISCELLANEOUS) ×2 IMPLANT
CORD BIPOLAR FORCEPS 12FT (ELECTRODE) ×2 IMPLANT
COVER PROBE W GEL 5X96 (DRAPES) ×2 IMPLANT
DERMABOND ADVANCED (GAUZE/BANDAGES/DRESSINGS) ×2
DERMABOND ADVANCED .7 DNX12 (GAUZE/BANDAGES/DRESSINGS) ×2 IMPLANT
DRAPE C-ARM 35X43 STRL (DRAPES) ×2 IMPLANT
DRAPE HEAD BAR (DRAPES) ×1 IMPLANT
DRAPE INCISE IOBAN 66X45 STRL (DRAPES) ×2 IMPLANT
DRAPE MICROSCOPE WILD 40.5X102 (DRAPES) ×2 IMPLANT
DRAPE UTILITY XL STRL (DRAPES) ×2 IMPLANT
DRSG TEGADERM 2-3/8X2-3/4 SM (GAUZE/BANDAGES/DRESSINGS) ×3 IMPLANT
DRSG TEGADERM 4X4.75 (GAUZE/BANDAGES/DRESSINGS) ×1 IMPLANT
ELECT COATED BLADE 2.86 ST (ELECTRODE) ×2 IMPLANT
ELECT EMG 18 NIMS (NEUROSURGERY SUPPLIES) ×2
ELECT REM PT RETURN 9FT ADLT (ELECTROSURGICAL) ×2
ELECTRODE EMG 18 NIMS (NEUROSURGERY SUPPLIES) ×1 IMPLANT
ELECTRODE REM PT RTRN 9FT ADLT (ELECTROSURGICAL) ×1 IMPLANT
FORCEPS BIPOLAR SPETZLER 8 1.0 (NEUROSURGERY SUPPLIES) ×2 IMPLANT
GAUZE 4X4 16PLY ~~LOC~~+RFID DBL (SPONGE) ×2 IMPLANT
GAUZE SPONGE 4X4 12PLY STRL (GAUZE/BANDAGES/DRESSINGS) ×2 IMPLANT
GENERATOR PULSE INSPIRE (Generator) ×2 IMPLANT
GENERATOR PULSE INSPIRE IV (Generator) ×1 IMPLANT
GLOVE SURG ENC MOIS LTX SZ6.5 (GLOVE) IMPLANT
GLOVE SURG ENC MOIS LTX SZ7.5 (GLOVE) ×3 IMPLANT
GLOVE SURG POLYISO LF SZ7 (GLOVE) ×3 IMPLANT
GLOVE SURG UNDER POLY LF SZ7 (GLOVE) ×2 IMPLANT
GOWN STRL REUS W/ TWL LRG LVL3 (GOWN DISPOSABLE) ×3 IMPLANT
GOWN STRL REUS W/TWL LRG LVL3 (GOWN DISPOSABLE) ×8
IV CATH 18G SAFETY (IV SOLUTION) ×2 IMPLANT
KIT NEURO ACCESSORY W/WRENCH (MISCELLANEOUS) IMPLANT
LEAD SENSING RESP INSPIRE (Lead) ×2 IMPLANT
LEAD SENSING RESP INSPIRE IV (Lead) ×1 IMPLANT
LEAD SLEEP STIM INSPIRE IV/V (Lead) ×1 IMPLANT
LEAD SLEEP STIMULATION INSPIRE (Lead) ×2 IMPLANT
LOOP VESSEL MAXI BLUE (MISCELLANEOUS) ×2 IMPLANT
LOOP VESSEL MINI RED (MISCELLANEOUS) ×2 IMPLANT
MARKER SKIN DUAL TIP RULER LAB (MISCELLANEOUS) ×4 IMPLANT
NDL HYPO 25X1 1.5 SAFETY (NEEDLE) ×1 IMPLANT
NEEDLE HYPO 25X1 1.5 SAFETY (NEEDLE) ×2 IMPLANT
NS IRRIG 1000ML POUR BTL (IV SOLUTION) ×2 IMPLANT
PACK BASIN DAY SURGERY FS (CUSTOM PROCEDURE TRAY) ×2 IMPLANT
PACK ENT DAY SURGERY (CUSTOM PROCEDURE TRAY) ×2 IMPLANT
PASSER CATH 36 CODMAN DISP (NEUROSURGERY SUPPLIES) ×1 IMPLANT
PASSER CATH 38CM DISP (INSTRUMENTS) IMPLANT
PENCIL SMOKE EVACUATOR (MISCELLANEOUS) ×2 IMPLANT
PROBE NERVE STIMULATOR (NEUROSURGERY SUPPLIES) ×2 IMPLANT
REMOTE CONTROL SLEEP INSPIRE (MISCELLANEOUS) ×2 IMPLANT
SET WALTER ACTIVATION W/DRAPE (SET/KITS/TRAYS/PACK) ×2 IMPLANT
SLEEVE SCD COMPRESS KNEE MED (STOCKING) ×2 IMPLANT
SPONGE INTESTINAL PEANUT (DISPOSABLE) ×2 IMPLANT
STAPLER VISISTAT 35W (STAPLE) IMPLANT
SUT SILK 2 0 SH (SUTURE) ×2 IMPLANT
SUT SILK 3 0 REEL (SUTURE) ×2 IMPLANT
SUT SILK 3 0 SH 30 (SUTURE) ×2 IMPLANT
SUT SILK 3-0 (SUTURE) ×2
SUT SILK 3-0 RB1 30XBRD (SUTURE) ×1
SUT VIC AB 3-0 SH 27 (SUTURE) ×4
SUT VIC AB 3-0 SH 27X BRD (SUTURE) ×1 IMPLANT
SUT VIC AB 4-0 PS2 27 (SUTURE) ×3 IMPLANT
SUTURE SILK 3-0 RB1 30XBRD (SUTURE) ×2 IMPLANT
SYR 10ML LL (SYRINGE) ×2 IMPLANT
SYR BULB EAR ULCER 3OZ GRN STR (SYRINGE) ×2 IMPLANT
TOWEL GREEN STERILE FF (TOWEL DISPOSABLE) ×4 IMPLANT

## 2021-07-04 NOTE — Transfer of Care (Signed)
Immediate Anesthesia Transfer of Care Note  Patient: Jim Smith  Procedure(s) Performed: IMPLANTATION OF HYPOGLOSSAL NERVE STIMULATOR (Right: Chest)  Patient Location: PACU  Anesthesia Type:General  Level of Consciousness: sedated  Airway & Oxygen Therapy: Patient Spontanous Breathing and Patient connected to face mask oxygen  Post-op Assessment: Report given to RN and Post -op Vital signs reviewed and stable  Post vital signs: Reviewed and stable  Last Vitals:  Vitals Value Taken Time  BP 123/66 07/04/21 1334  Temp    Pulse 60 07/04/21 1337  Resp 12 07/04/21 1337  SpO2 98 % 07/04/21 1337  Vitals shown include unvalidated device data.  Last Pain:  Vitals:   07/04/21 0902  TempSrc: Oral  PainSc: 1       Patients Stated Pain Goal: 1 (07/04/21 0902)  Complications: No notable events documented.

## 2021-07-04 NOTE — Anesthesia Postprocedure Evaluation (Signed)
Anesthesia Post Note  Patient: Jim Smith  Procedure(s) Performed: IMPLANTATION OF HYPOGLOSSAL NERVE STIMULATOR (Right: Chest)     Patient location during evaluation: PACU Anesthesia Type: General Level of consciousness: awake and alert Pain management: pain level controlled Vital Signs Assessment: post-procedure vital signs reviewed and stable Respiratory status: spontaneous breathing, nonlabored ventilation and respiratory function stable Cardiovascular status: blood pressure returned to baseline and stable Postop Assessment: no apparent nausea or vomiting Anesthetic complications: no   No notable events documented.  Last Vitals:  Vitals:   07/04/21 1445 07/04/21 1500  BP: 124/79 116/74  Pulse: 62 61  Resp: 14 10  Temp:    SpO2: 94% 94%    Last Pain:  Vitals:   07/04/21 1500  TempSrc:   PainSc: 4                  Lowella Curb

## 2021-07-04 NOTE — Brief Op Note (Signed)
07/04/2021  1:14 PM  PATIENT:  Lula Olszewski  55 y.o. male  PRE-OPERATIVE DIAGNOSIS:  obstructive sleep apnea  POST-OPERATIVE DIAGNOSIS:  obstructive sleep apnea  PROCEDURE:  Procedure(s): IMPLANTATION OF HYPOGLOSSAL NERVE STIMULATOR (Right)  SURGEON:  Surgeon(s) and Role:    Christia Reading, MD - Primary  PHYSICIAN ASSISTANT: Clovis Cao  ASSISTANTS: none   ANESTHESIA:   general  EBL: Minimal  BLOOD ADMINISTERED:none  DRAINS: none   LOCAL MEDICATIONS USED:  LIDOCAINE   SPECIMEN:  No Specimen  DISPOSITION OF SPECIMEN:  N/A  COUNTS:  YES  TOURNIQUET:  * No tourniquets in log *  DICTATION: .Note written in EPIC  PLAN OF CARE: Discharge to home after PACU  PATIENT DISPOSITION:  PACU - hemodynamically stable.   Delay start of Pharmacological VTE agent (>24hrs) due to surgical blood loss or risk of bleeding: no

## 2021-07-04 NOTE — Anesthesia Procedure Notes (Signed)
Procedure Name: Intubation Date/Time: 07/04/2021 11:18 AM Performed by: Signe Colt, CRNA Pre-anesthesia Checklist: Patient identified, Emergency Drugs available, Suction available and Patient being monitored Patient Re-evaluated:Patient Re-evaluated prior to induction Oxygen Delivery Method: Circle system utilized Preoxygenation: Pre-oxygenation with 100% oxygen Induction Type: IV induction Ventilation: Mask ventilation without difficulty Laryngoscope Size: Mac and 3 Grade View: Grade III Tube type: Oral Rae Tube size: 7.0 mm Number of attempts: 1 Airway Equipment and Method: Stylet and Oral airway Placement Confirmation: ETT inserted through vocal cords under direct vision, positive ETCO2 and breath sounds checked- equal and bilateral Secured at: 22 cm Tube secured with: Tape Dental Injury: Teeth and Oropharynx as per pre-operative assessment

## 2021-07-04 NOTE — Anesthesia Preprocedure Evaluation (Signed)
Anesthesia Evaluation  Patient identified by MRN, date of birth, ID band Patient awake    Reviewed: Allergy & Precautions, NPO status , Patient's Chart, lab work & pertinent test results, reviewed documented beta blocker date and time   Airway Mallampati: I  TM Distance: >3 FB Neck ROM: Full    Dental  (+) Chipped, Dental Advisory Given, Caps,    Pulmonary sleep apnea ,    Pulmonary exam normal breath sounds clear to auscultation       Cardiovascular hypertension, Pt. on home beta blockers and Pt. on medications Normal cardiovascular exam Rhythm:Regular Rate:Normal  HLD   Neuro/Psych  Headaches, PSYCHIATRIC DISORDERS Anxiety    GI/Hepatic Neg liver ROS, GERD  Controlled and Medicated,  Endo/Other  negative endocrine ROS  Renal/GU negative Renal ROS  negative genitourinary   Musculoskeletal negative musculoskeletal ROS (+)   Abdominal   Peds  Hematology negative hematology ROS (+)   Anesthesia Other Findings   Reproductive/Obstetrics                             Anesthesia Physical  Anesthesia Plan  ASA: 2  Anesthesia Plan: General   Post-op Pain Management:    Induction: Intravenous  PONV Risk Score and Plan: 2 and Treatment may vary due to age or medical condition, Ondansetron and Midazolam  Airway Management Planned: Oral ETT  Additional Equipment:   Intra-op Plan:   Post-operative Plan: Extubation in OR  Informed Consent: I have reviewed the patients History and Physical, chart, labs and discussed the procedure including the risks, benefits and alternatives for the proposed anesthesia with the patient or authorized representative who has indicated his/her understanding and acceptance.     Dental advisory given  Plan Discussed with: CRNA  Anesthesia Plan Comments:         Anesthesia Quick Evaluation

## 2021-07-04 NOTE — Op Note (Signed)

## 2021-07-04 NOTE — Discharge Instructions (Signed)
*  You had 5 mg of Oxycodone at 3:25 pm 07/04/21  Post Anesthesia Home Care Instructions  Activity: Get plenty of rest for the remainder of the day. A responsible individual must stay with you for 24 hours following the procedure.  For the next 24 hours, DO NOT: -Drive a car -Advertising copywriter -Drink alcoholic beverages -Take any medication unless instructed by your physician -Make any legal decisions or sign important papers.  Meals: Start with liquid foods such as gelatin or soup. Progress to regular foods as tolerated. Avoid greasy, spicy, heavy foods. If nausea and/or vomiting occur, drink only clear liquids until the nausea and/or vomiting subsides. Call your physician if vomiting continues.  Special Instructions/Symptoms: Your throat may feel dry or sore from the anesthesia or the breathing tube placed in your throat during surgery. If this causes discomfort, gargle with warm salt water. The discomfort should disappear within 24 hours.  If you had a scopolamine patch placed behind your ear for the management of post- operative nausea and/or vomiting:  1. The medication in the patch is effective for 72 hours, after which it should be removed.  Wrap patch in a tissue and discard in the trash. Wash hands thoroughly with soap and water. 2. You may remove the patch earlier than 72 hours if you experience unpleasant side effects which may include dry mouth, dizziness or visual disturbances. 3. Avoid touching the patch. Wash your hands with soap and water after contact with the patch.

## 2021-07-04 NOTE — H&P (Signed)
Jim Smith is an 55 y.o. male.   Chief Complaint: Sleep apnea HPI: 55 year old male with obstructive sleep apnea who has not tolerated CPAP.  Past Medical History:  Diagnosis Date   Acute ear infection    Anxiety    Bronchitis    GERD (gastroesophageal reflux disease)    H/O removal of neck cyst 01/2012   R anterior neck I&D, with abx   Hyperlipidemia    Hypertension    Migraine    Sleep apnea    uses Bipap occaisionally    Past Surgical History:  Procedure Laterality Date   DRUG INDUCED ENDOSCOPY N/A 11/01/2020   Procedure: DRUG INDUCED ENDOSCOPY;  Surgeon: Christia Reading, MD;  Location: [facility];  Service: ENT;  Laterality: N/A;   FRACTURE SURGERY Left 2009   tibial fx   PILONIDAL CYST EXCISION     SUBMANDIBULAR GLAND EXCISION Right 05/17/2016   Procedure: INTRA ORAL EXCISION RIGHT SUBLINGUAL GLAND;  Surgeon: Serena Colonel, MD;  Location: [facility];  Service: ENT;  Laterality: Right;    Family History  Problem Relation Age of Onset   Parkinson's disease Mother    Depression Father    Liver cancer Maternal Grandfather    Social History:  reports that he has never smoked. He has never used smokeless tobacco. He reports that he does not drink alcohol and does not use drugs.  Allergies:  Allergies  Allergen Reactions   Penicillins Rash    Medications Prior to Admission  Medication Sig Dispense Refill   buPROPion (WELLBUTRIN SR) 200 MG 12 hr tablet Take 200 mg by mouth 2 (two) times daily.     cyclobenzaprine (FLEXERIL) 10 MG tablet Take 10 mg by mouth 3 (three) times daily as needed for muscle spasms.     montelukast (SINGULAIR) 10 MG tablet Take 10 mg by mouth every morning.     pantoprazole (PROTONIX) 40 MG tablet Take 40 mg by mouth 2 (two) times daily.     propranolol (INDERAL) 80 MG tablet Take 80 mg by mouth every morning.     sertraline (ZOLOFT) 50 MG tablet Take 50 mg by mouth daily.     ondansetron (ZOFRAN ODT) 4 MG  disintegrating tablet Take 1 tablet (4 mg total) by mouth every 8 (eight) hours as needed for nausea or vomiting. 20 tablet 0    No results found for this or any previous visit (from the past 48 hour(s)). No results found.  Review of Systems  All other systems reviewed and are negative.  Blood pressure 127/89, pulse 63, temperature 97.9 F (36.6 C), temperature source Oral, resp. rate 16, height 6\' 2"  (1.88 m), weight 90.1 kg, SpO2 100 %. Physical Exam Constitutional:      Appearance: Normal appearance. He is normal weight.  HENT:     Head: Normocephalic and atraumatic.     Right Ear: External ear normal.     Left Ear: External ear normal.     Nose: Nose normal.     Mouth/Throat:     Mouth: Mucous membranes are moist.     Pharynx: Oropharynx is clear.  Eyes:     Extraocular Movements: Extraocular movements intact.     Conjunctiva/sclera: Conjunctivae normal.     Pupils: Pupils are equal, round, and reactive to light.  Cardiovascular:     Rate and Rhythm: Normal rate.  Pulmonary:     Effort: Pulmonary effort is normal.  Musculoskeletal:     Cervical back: Normal range  of motion.  Skin:    General: Skin is warm and dry.  Neurological:     General: No focal deficit present.     Mental Status: He is alert and oriented to person, place, and time. Mental status is at baseline.  Psychiatric:        Mood and Affect: Mood normal.        Behavior: Behavior normal.        Thought Content: Thought content normal.        Judgment: Judgment normal.     Assessment/Plan Obstructive sleep apnea  To OR for hypoglossal nerve stimulator placement.  Christia Reading, MD 07/04/2021, 10:45 AM

## 2021-07-06 ENCOUNTER — Encounter (HOSPITAL_BASED_OUTPATIENT_CLINIC_OR_DEPARTMENT_OTHER): Payer: Self-pay | Admitting: Otolaryngology

## 2022-03-03 ENCOUNTER — Emergency Department (HOSPITAL_COMMUNITY): Payer: Commercial Managed Care - PPO

## 2022-03-03 ENCOUNTER — Emergency Department (HOSPITAL_COMMUNITY)
Admission: EM | Admit: 2022-03-03 | Discharge: 2022-03-03 | Disposition: A | Discharge status: Home / Self Care | Payer: Commercial Managed Care - PPO | Attending: Emergency Medicine | Admitting: Emergency Medicine

## 2022-03-03 ENCOUNTER — Encounter (HOSPITAL_COMMUNITY): Payer: Self-pay

## 2022-03-03 ENCOUNTER — Other Ambulatory Visit: Payer: Self-pay

## 2022-03-03 DIAGNOSIS — R0789 Other chest pain: Secondary | ICD-10-CM | POA: Insufficient documentation

## 2022-03-03 DIAGNOSIS — M542 Cervicalgia: Secondary | ICD-10-CM | POA: Diagnosis not present

## 2022-03-03 DIAGNOSIS — Z20822 Contact with and (suspected) exposure to covid-19: Secondary | ICD-10-CM | POA: Diagnosis not present

## 2022-03-03 DIAGNOSIS — R079 Chest pain, unspecified: Secondary | ICD-10-CM

## 2022-03-03 LAB — CBC
HCT: 41.6 % (ref 39.0–52.0)
Hemoglobin: 14 g/dL (ref 13.0–17.0)
MCH: 32.1 pg (ref 26.0–34.0)
MCHC: 33.7 g/dL (ref 30.0–36.0)
MCV: 95.4 fL (ref 80.0–100.0)
Platelets: 351 10*3/uL (ref 150–400)
RBC: 4.36 MIL/uL (ref 4.22–5.81)
RDW: 12.8 % (ref 11.5–15.5)
WBC: 12.1 10*3/uL — ABNORMAL HIGH (ref 4.0–10.5)
nRBC: 0 % (ref 0.0–0.2)

## 2022-03-03 LAB — RESP PANEL BY RT-PCR (FLU A&B, COVID) ARPGX2
Influenza A by PCR: NEGATIVE
Influenza B by PCR: NEGATIVE
SARS Coronavirus 2 by RT PCR: NEGATIVE

## 2022-03-03 LAB — BASIC METABOLIC PANEL
Anion gap: 8 (ref 5–15)
BUN: 25 mg/dL — ABNORMAL HIGH (ref 6–20)
CO2: 26 mmol/L (ref 22–32)
Calcium: 8.7 mg/dL — ABNORMAL LOW (ref 8.9–10.3)
Chloride: 97 mmol/L — ABNORMAL LOW (ref 98–111)
Creatinine, Ser: 1.14 mg/dL (ref 0.61–1.24)
GFR, Estimated: >60 mL/min (ref >60)
Glucose, Bld: 111 mg/dL — ABNORMAL HIGH (ref 70–99)
Potassium: 4.1 mmol/L (ref 3.5–5.1)
Sodium: 131 mmol/L — ABNORMAL LOW (ref 135–145)

## 2022-03-03 LAB — TROPONIN I (HIGH SENSITIVITY)
Troponin I (High Sensitivity): 3 ng/L (ref <18)
Troponin I (High Sensitivity): 3 ng/L (ref <18)

## 2022-03-03 LAB — D-DIMER, QUANTITATIVE: D-Dimer, Quant: <0.27 ug/mL-FEU (ref 0.00–0.50)

## 2022-03-03 MED ORDER — SODIUM CHLORIDE 0.9 % IV BOLUS
1000.0000 mL | Freq: Once | INTRAVENOUS | Status: AC
  Administered 2022-03-03: 1000 mL via INTRAVENOUS

## 2022-03-03 NOTE — Discharge Instructions (Addendum)
Try pepcid or tagamet up to twice a day.  Try to avoid things that may make this worse, most commonly these are spicy foods tomato based products fatty foods chocolate and peppermint.  Alcohol and tobacco can also make this worse.  Return to the emergency department for sudden worsening pain fever or inability to eat or drink.  

## 2022-03-03 NOTE — ED Provider Notes (Signed)
56 yo M I received in signout from Dr. Alvino Chapel.  Briefly the patient is a 56 year old male with a chief complaints of chest discomfort.  This was reported to be atypical.  Awaiting second troponin.  Second troponin is resulted and is negative.  On reassessment the patient tells me that he has a new implanted electrode to try and help him with sleep apnea and he is worried that he will be able to sleep tonight and would like to stay in the hospital.  He is otherwise well-appearing and nontoxic.  I reviewed his labs without obvious concern.  Do not feel he would benefit from an overnight stay in the hospital.  We will have him follow-up with his family doctor. ?  Deno Etienne, DO ?03/03/22 1727 ? ?

## 2022-03-03 NOTE — ED Provider Notes (Addendum)
?[facility] EMERGENCY DEPARTMENT ?Provider Note ? ? ?[name]: [id number] ?Arrival date & time: 03/03/22  1236 ? ?  ? ?History ? ?Chief Complaint  ?Patient presents with  ? Chest Pain  ? ? ?Jim Smith is a 56 y.o. male. ? ? ?Chest Pain ?Associated symptoms: no abdominal pain, no back pain, no shortness of breath and no weakness   ?Patient with chest pain.  Anterior chest.  Started this morning.  Some shortness of breath activity.  Has been feeling bad for a while now.  Has been seen by PCP and went to urgent care today.  Urgent care sent patient and concerned he was having a STEMI.  Do not see ST elevation on EKG however.  Patient is dull.  States he has not been able exertion sublingual before.  States he gets sharp pains in the upper chest.  Had been on Inderal previously.  No swelling in his legs has had previous leg injury.  Has had a leukocytosis from PCP. ?Has a pain in his left neck and felt that the spasm around his chest was related to that neck pain. ?  ? ?Home Medications ?Prior to Admission medications   ?Medication Sig Start Date End Date Taking? Authorizing Provider  ?buPROPion (WELLBUTRIN SR) 200 MG 12 hr tablet Take 200 mg by mouth 2 (two) times daily. 12/15/15  Yes [provider]  ?cyclobenzaprine (FLEXERIL) 10 MG tablet Take 10 mg by mouth 3 (three) times daily as needed for muscle spasms.   Yes [provider]  ?gabapentin (NEURONTIN) 300 MG capsule Take 300 mg by mouth in the morning, at noon, and at bedtime. 01/21/22  Yes [provider]  ?montelukast (SINGULAIR) 10 MG tablet Take 10 mg by mouth every morning.   Yes [provider]  ?oxycodone (OXY-IR) 5 MG capsule Take 5 mg by mouth every 4 (four) hours as needed for pain.   Yes [provider]  ?pantoprazole (PROTONIX) 40 MG tablet Take 40 mg by mouth 2 (two) times daily.   Yes [provider]  ?propranolol (INDERAL) 80 MG tablet Take 80 mg by mouth every morning.   Yes  [provider]  ?sertraline (ZOLOFT) 100 MG tablet Take 100 mg by mouth daily. 02/28/22  Yes [provider]  ?HYDROcodone-acetaminophen (NORCO/VICODIN) 5-325 MG tablet Take 1-2 tablets by mouth every 6 (six) hours as needed for moderate pain. ?Patient not taking: Reported on 03/03/2022 07/04/21 07/04/22  Christia Reading, MD  ?ondansetron [facility] ODT) 4 MG disintegrating tablet Take 1 tablet (4 mg total) by mouth every 8 (eight) hours as needed for nausea or vomiting. ?Patient not taking: Reported on 03/03/2022 09/24/20   Durward Parcel, FNP  ?   ? ?Allergies    ?Sulfamethoxazole-trimethoprim and Penicillins   ? ?Review of Systems   ?Review of Systems  ?Constitutional:  Negative for appetite change.  ?Respiratory:  Negative for shortness of breath.   ?Cardiovascular:  Positive for chest pain.  ?Gastrointestinal:  Negative for abdominal pain.  ?Musculoskeletal:  Negative for back pain.  ?Neurological:  Negative for weakness.  ? ?Physical Exam ?Updated Vital Signs ?BP (!) 142/93   Pulse (!) 58   Temp 97.9 ?F (36.6 ?C) (Oral)   Resp 15   Ht 6\' 2"  (1.88 m)   Wt 100 kg   SpO2 100%   BMI 28.31 kg/m?  ?Physical Exam ?Vitals reviewed.  ?HENT:  ?   Head: Normocephalic.  ?Cardiovascular:  ?   Rate and Rhythm: Normal  rate.  ?Chest:  ?   Chest wall: Tenderness present.  ?   Comments: Tenderness to anterior chest.  No swelling in his leg.  No rash. ?Musculoskeletal:  ?   Right lower leg: No edema.  ?   Left lower leg: No edema.  ?Skin: ?   General: Skin is warm.  ?   Capillary Refill: Capillary refill takes less than 2 seconds.  ?Neurological:  ?   Mental Status: He is alert.  ? ? ?ED Results / Procedures / Treatments   ?Labs ?(all labs ordered are listed, but only abnormal results are displayed) ?Labs Reviewed  ?BASIC METABOLIC PANEL - Abnormal; Notable for the following components:  ?    Result Value  ? Sodium 131 (*)   ? Chloride 97 (*)   ? Glucose, Bld 111 (*)   ? BUN 25 (*)   ? Calcium 8.7 (*)   ? All other  components within normal limits  ?CBC - Abnormal; Notable for the following components:  ? WBC 12.1 (*)   ? All other components within normal limits  ?RESP PANEL BY RT-PCR (FLU A&B, COVID) ARPGX2  ?D-DIMER, QUANTITATIVE  ?TROPONIN I (HIGH SENSITIVITY)  ?TROPONIN I (HIGH SENSITIVITY)  ? ? ?EKG ?EKG Interpretation ? ?Date/Time:  Sunday March 03 2022 12:43:28 EST ?Ventricular Rate:  61 ?PR Interval:  200 ?QRS Duration: 99 ?QT Interval:  415 ?QTC Calculation: 418 ?R Axis:   88 ?Text Interpretation: Sinus rhythm Nonspecific inferior ST changes. Confirmed by Benjiman Core [phone]) on 03/03/2022 12:46:11 PM ? ?Radiology ?DG Chest 2 View ? ?Result Date: 03/03/2022 ?CLINICAL DATA:  Left-sided chest burning beginning this morning. EXAM: CHEST - 2 VIEW COMPARISON:  07/04/2021 FINDINGS: Cardiac silhouette is normal in size and configuration. Normal mediastinal and hilar contours. Clear lungs. No pleural effusion or pneumothorax. Stable right anterior chest wall nerve stimulator. Skeletal structures are intact. IMPRESSION: No active cardiopulmonary disease. Electronically Signed   By: Amie Portland M.D.   On: 03/03/2022 13:14   ? ?Procedures ?Procedures  ? ? ?Medications Ordered in ED ?Medications  ?sodium chloride 0.9 % bolus 1,000 mL (1,000 mLs Intravenous New Bag/Given 03/03/22 1540)  ? ? ?ED Course/ Medical Decision Making/ A&P ?  ?                        ?Medical Decision Making ?Amount and/or Complexity of Data Reviewed ?External Data Reviewed: notes. ?   Details: Reviewed PCP notes from recent visit.  Also reviewed urgent care note from earlier today ?Labs: ordered. ?Radiology: ordered and independent interpretation performed. ? ? ?Patient with chest pain.  Sent in for STEMI but does not appear to having a STEMI right now.  Had been seen by PCP.  Some mild hypertension.  Troponin initially negative.  Chest x-ray also independently interpreted by me and showed no acute cardiac cause.  White count mildly elevated but similar  to where it has been.  Mild hyponatremia with sodium 131.  We will give fluid bolus.  Second troponin pending.  Care turned over to Dr. Adela Lank. ? ? ? ? ? ? ? ?Final Clinical Impression(s) / ED Diagnoses ?Final diagnoses:  ?Nonspecific chest pain  ? ? ?Rx / DC Orders ?ED Discharge Orders   ? ? None  ? ?  ? ? ?  ?Benjiman Core, MD ?03/03/22 1541 ? ?  ?Benjiman Core, MD ?03/03/22 1547 ? ?

## 2022-03-03 NOTE — ED Triage Notes (Signed)
Pt bib [facility] from [facility] with complaints of left sided chest burning that started this am around 0300. Pt complains of increased shob with activity. Pt has also been out of some medications for a month, but started them back Thursday. AOx4 ?EMS vitals: 180/100, 98% RA, 99 CBG ?Pt was given NS, 324mg  ASA en route ?

## 2022-03-03 NOTE — ED Notes (Signed)
Pt ambulated to bathroom with oxygen saturations 98% on room air.  ?

## 2022-03-07 ENCOUNTER — Emergency Department (HOSPITAL_COMMUNITY)
Admission: EM | Admit: 2022-03-07 | Discharge: 2022-03-08 | Disposition: A | Discharge status: Home / Self Care | Payer: Commercial Managed Care - PPO | Attending: Emergency Medicine | Admitting: Emergency Medicine

## 2022-03-07 ENCOUNTER — Encounter (HOSPITAL_COMMUNITY): Payer: Self-pay

## 2022-03-07 ENCOUNTER — Other Ambulatory Visit: Payer: Self-pay

## 2022-03-07 ENCOUNTER — Emergency Department (HOSPITAL_COMMUNITY): Payer: Commercial Managed Care - PPO

## 2022-03-07 DIAGNOSIS — K469 Unspecified abdominal hernia without obstruction or gangrene: Secondary | ICD-10-CM | POA: Diagnosis not present

## 2022-03-07 DIAGNOSIS — M549 Dorsalgia, unspecified: Secondary | ICD-10-CM | POA: Diagnosis not present

## 2022-03-07 DIAGNOSIS — R1030 Lower abdominal pain, unspecified: Secondary | ICD-10-CM

## 2022-03-07 DIAGNOSIS — R1031 Right lower quadrant pain: Secondary | ICD-10-CM | POA: Diagnosis present

## 2022-03-07 LAB — COMPREHENSIVE METABOLIC PANEL
ALT: 22 U/L (ref 0–44)
AST: 32 U/L (ref 15–41)
Albumin: 4 g/dL (ref 3.5–5.0)
Alkaline Phosphatase: 50 U/L (ref 38–126)
Anion gap: 9 (ref 5–15)
BUN: 19 mg/dL (ref 6–20)
CO2: 25 mmol/L (ref 22–32)
Calcium: 8.7 mg/dL — ABNORMAL LOW (ref 8.9–10.3)
Chloride: 99 mmol/L (ref 98–111)
Creatinine, Ser: 1.19 mg/dL (ref 0.61–1.24)
GFR, Estimated: >60 mL/min (ref >60)
Glucose, Bld: 111 mg/dL — ABNORMAL HIGH (ref 70–99)
Potassium: 4.7 mmol/L (ref 3.5–5.1)
Sodium: 133 mmol/L — ABNORMAL LOW (ref 135–145)
Total Bilirubin: 1.3 mg/dL — ABNORMAL HIGH (ref 0.3–1.2)
Total Protein: 6.8 g/dL (ref 6.5–8.1)

## 2022-03-07 LAB — CBC WITH DIFFERENTIAL/PLATELET
Abs Immature Granulocytes: 0.07 10*3/uL (ref 0.00–0.07)
Basophils Absolute: 0.1 10*3/uL (ref 0.0–0.1)
Basophils Relative: 1 %
Eosinophils Absolute: 0.1 10*3/uL (ref 0.0–0.5)
Eosinophils Relative: 1 %
HCT: 41 % (ref 39.0–52.0)
Hemoglobin: 14.2 g/dL (ref 13.0–17.0)
Immature Granulocytes: 1 %
Lymphocytes Relative: 18 %
Lymphs Abs: 1.9 10*3/uL (ref 0.7–4.0)
MCH: 32.9 pg (ref 26.0–34.0)
MCHC: 34.6 g/dL (ref 30.0–36.0)
MCV: 95.1 fL (ref 80.0–100.0)
Monocytes Absolute: 1 10*3/uL (ref 0.1–1.0)
Monocytes Relative: 10 %
Neutro Abs: 7.4 10*3/uL (ref 1.7–7.7)
Neutrophils Relative %: 69 %
Platelets: 305 10*3/uL (ref 150–400)
RBC: 4.31 MIL/uL (ref 4.22–5.81)
RDW: 12.7 % (ref 11.5–15.5)
WBC: 10.6 10*3/uL — ABNORMAL HIGH (ref 4.0–10.5)
nRBC: 0 % (ref 0.0–0.2)

## 2022-03-07 LAB — URINALYSIS, ROUTINE W REFLEX MICROSCOPIC
Bacteria, UA: NONE SEEN
Bilirubin Urine: NEGATIVE
Glucose, UA: NEGATIVE mg/dL
Ketones, ur: NEGATIVE mg/dL
Leukocytes,Ua: NEGATIVE
Nitrite: NEGATIVE
Protein, ur: NEGATIVE mg/dL
Specific Gravity, Urine: >1.046 — ABNORMAL HIGH (ref 1.005–1.030)
pH: 7 (ref 5.0–8.0)

## 2022-03-07 MED ORDER — ONDANSETRON HCL 4 MG/2ML IJ SOLN
4.0000 mg | Freq: Once | INTRAMUSCULAR | Status: AC
  Administered 2022-03-07: 21:00:00 4 mg via INTRAVENOUS
  Filled 2022-03-07: qty 2

## 2022-03-07 MED ORDER — OXYCODONE HCL 5 MG PO TABS: 5.0000 mg | ORAL_TABLET | Freq: Three times a day (TID) | ORAL | 0 refills | Status: AC | PRN

## 2022-03-07 MED ORDER — KETOROLAC TROMETHAMINE 15 MG/ML IJ SOLN
15.0000 mg | Freq: Once | INTRAMUSCULAR | Status: AC
  Administered 2022-03-07: 21:00:00 15 mg via INTRAVENOUS
  Filled 2022-03-07: qty 1

## 2022-03-07 MED ORDER — SODIUM CHLORIDE 0.9 % IV BOLUS
1000.0000 mL | Freq: Once | INTRAVENOUS | Status: AC
  Administered 2022-03-07: 21:00:00 1000 mL via INTRAVENOUS

## 2022-03-07 MED ORDER — IOHEXOL 300 MG/ML  SOLN
100.0000 mL | Freq: Once | INTRAMUSCULAR | Status: AC | PRN
  Administered 2022-03-07: 17:00:00 100 mL via INTRAVENOUS

## 2022-03-07 NOTE — ED Triage Notes (Signed)
Pt arrived via POV for sharp right and left groin painx3 days. Pt states he was dx with bilat groin hernia. Pt states the groin pain in the right is getting worse. Pt states he hasn't Bmx1 wk. Pt states is passing gas. Pt is diaphretic. ?

## 2022-03-07 NOTE — ED Provider Notes (Signed)
? ?[facility]  ?Provider Note ? ?[name]: [id number] ?Arrival date & time: 03/07/22 1501 ? ?History ?Chief Complaint  ?Patient presents with  ? Groin Pain  ? ? ?Lula OlszewskiRobert Legrand is a 56 y.o. male who presents today with concern for bilateral groin pain.  He has known hernias there.  He states they have been more painful recently.  He was seen by his primary care doctor and was told to come the emergency room given the worsening pain.  He is been constipated recently.  He feels the pain in his groin fluctuates.  He also has severe chronic back pain that has been for any sleeping or ambulating recently. ? ? ?Home Medications ?Prior to Admission medications   ?Medication Sig Start Date End Date Taking? Authorizing Provider  ?oxyCODONE (ROXICODONE) 5 MG immediate release tablet Take 1 tablet (5 mg total) by mouth every 8 (eight) hours as needed for up to 3 days for severe pain. 03/07/22 03/10/22 Yes Edison SimonBestler, Earleen Aoun, MD  ?buPROPion [facility](WELLBUTRIN SR) 200 MG 12 hr tablet Take 200 mg by mouth 2 (two) times daily. 12/15/15   [provider]  ?cyclobenzaprine (FLEXERIL) 10 MG tablet Take 10 mg by mouth 3 (three) times daily as needed for muscle spasms.    [provider]  ?gabapentin (NEURONTIN) 300 MG capsule Take 300 mg by mouth in the morning, at noon, and at bedtime. 01/21/22   [provider]  ?HYDROcodone-acetaminophen (NORCO/VICODIN) 5-325 MG tablet Take 1-2 tablets by mouth every 6 (six) hours as needed for moderate pain. ?Patient not taking: Reported on 03/03/2022 07/04/21 07/04/22  Christia ReadingBates, Dwight, MD  ?montelukast (SINGULAIR) 10 MG tablet Take 10 mg by mouth every morning.    [provider]  ?ondansetron (ZOFRAN ODT) 4 MG disintegrating tablet Take 1 tablet (4 mg total) by mouth every 8 (eight) hours as needed for nausea or vomiting. ?Patient not taking: Reported on 03/03/2022 09/24/20   Durward ParcelAvegno, Komlanvi S, FNP  ?pantoprazole (PROTONIX) 40 MG tablet Take 40 mg by  mouth 2 (two) times daily.    [provider]  ?propranolol (INDERAL) 80 MG tablet Take 80 mg by mouth every morning.    [provider]  ?sertraline (ZOLOFT) 100 MG tablet Take 100 mg by mouth daily. 02/28/22   [provider]  ? ? ? ?Allergies    ?Sulfamethoxazole-trimethoprim and Penicillins ? ? ?Review of Systems   ?Review of Systems  ?Constitutional:  Negative for chills and fever.  ?HENT:  Negative for ear pain and sore throat.   ?Eyes:  Negative for pain and visual disturbance.  ?Respiratory:  Negative for cough and shortness of breath.   ?Cardiovascular:  Negative for chest pain and palpitations.  ?Gastrointestinal:  Positive for abdominal pain. Negative for vomiting.  ?Genitourinary:  Negative for dysuria and hematuria.  ?Musculoskeletal:  Positive for back pain. Negative for arthralgias.  ?Skin:  Negative for color change and rash.  ?Neurological:  Negative for seizures and syncope.  ?All other systems reviewed and are negative. ?Please see HPI for pertinent positives and negatives ? ?Physical Exam ?BP 134/85   Pulse 63   Temp (!) 97.5 ?F (36.4 ?C) (Temporal)   Resp 12   Ht 6\' 2"  (1.88 m)   Wt 100 kg   SpO2 98%   BMI 28.31 kg/m?  ? ?Physical Exam ?Vitals and nursing note reviewed.  ?Constitutional:   ?   General: He is not in acute distress. ?   Appearance: He is well-developed.  ?  HENT:  ?   Head: Normocephalic and atraumatic.  ?Eyes:  ?   Conjunctiva/sclera: Conjunctivae normal.  ?Cardiovascular:  ?   Rate and Rhythm: Normal rate and regular rhythm.  ?   Heart sounds: No murmur heard. ?Pulmonary:  ?   Effort: Pulmonary effort is normal. No respiratory distress.  ?   Breath sounds: Normal breath sounds.  ?Abdominal:  ?   Palpations: Abdomen is soft.  ?   Tenderness: There is no abdominal tenderness.  ?   Comments: Bilateral small hernias. Easily reduced. No signs of incarceration or strangulation  ?Musculoskeletal:     ?   General: No swelling.  ?   Cervical back: Neck  supple.  ?Skin: ?   General: Skin is warm and dry.  ?   Capillary Refill: Capillary refill takes less than 2 seconds.  ?Neurological:  ?   Mental Status: He is alert.  ?Psychiatric:     ?   Mood and Affect: Mood normal.  ? ? ?ED Results / Procedures / Treatments   ?EKG ?None ? ?Procedures ?Procedures ? ?Medications Ordered in the ED ?Medications  ?iohexol (OMNIPAQUE) 300 MG/ML solution 100 mL (100 mLs Intravenous Contrast Given 03/07/22 1713)  ?sodium chloride 0.9 % bolus 1,000 mL (0 mLs Intravenous Stopped 03/08/22 0004)  ?ketorolac (TORADOL) 15 MG/ML injection 15 mg (15 mg Intravenous Given 03/07/22 2119)  ?ondansetron [facility]) injection 4 mg (4 mg Intravenous Given 03/07/22 2119)  ? ? ? ?ED Course  ? ?  ? ? ?MDM  ? ?This patient presents to the ED for concern of groin pain, this involves an extensive number of treatment options, and is a complaint that carries with it a high risk of complications and morbidity.  The differential diagnosis includes incarcerated hernia, strangluated hernia, groin hernia, chronic pain.  ? ?Additional history obtained: ?Additional history obtained from family ?Records reviewed previous admission documents, Care Everywhere/External Records, and Primary Care Documents ? ?Lab Tests: ?I Ordered, and personally interpreted labs.  The pertinent results include:  mild hyponatremia and mild bilirubin elevation ? ?Imaging Studies ordered: ?I ordered imaging studies including CT scan abdomen pelvis   ?I independently visualized and interpreted imaging which showed small fat containing hernias, no incarceration. Constipation seen ?I agree with the radiologist interpretation ? ? ?Medical Decision Making: With concern for bilateral inguinal hernias.  These are reducible on my exam.  However, given the amount of pain he was having in triage, firstly provider ordered CT imaging.  Imaging was negative.  His work-up was also reassuring with no obvious abnormalities on his blood work other than a mild  hyponatremia and a mild bilirubin elevation.  Suspect this could be due to some amount of dehydration.  Patient also having chronic severe pain causing inability to function, inability to sleep.  It is having a massive impact on his life.  ? ?Patient was given a liter bolus of fluids here as he appears to be dry and states that he has not been drinking enough water.  He was given Toradol for pain relief.  He had significant improvement in symptoms.  In regards to his constipation, plan for MiraLAX at home.  In regards to pain management at home, we will plan for continuation of over-the-counter medications.  We will do a very short course of oxycodone in the hopes to break his pain cycle and provide him with some relief so that he is able to sleep as it seems to be causing issues throughout his entire life.  I reviewed his PDMP, he has no recent narcotic prescriptions. Patient is comfortable with this plan.  We discussed that this was a one-time prescription, we will not provide any refills.  He needs to see his primary care doctor and his neurosurgeon for ongoing pain control.  He voiced understanding and agreement with this plan.  We discussed return precautions the importance of close outpatient follow-up.  He voiced understanding and agreement. ? ?Complexity of problems addressed: ?Patient?s presentation is most consistent with  acute complicated illness/injury requiring diagnostic workup ? ?Disposition: ?After consideration of the diagnostic results and the patient?s response to treatment,  ?I feel that the patent would benefit from discharge home .  ? ?Patient seen in conjunction with my attending, Dr. Dalene Seltzer. ? ? ? ?Final Clinical Impression(s) / ED Diagnoses ?Final diagnoses:  ?Inguinal pain, unspecified laterality  ? ? ?Rx / DC Orders ?ED Discharge Orders   ? ?      Ordered  ?  oxyCODONE (ROXICODONE) 5 MG immediate release tablet  Every 8 hours PRN       ? 03/07/22 2347  ? ?  ?  ? ?  ? ? ?  ?Edison Simon, MD ?03/08/22 1636 ? ?  ?Alvira Monday, MD ?03/09/22 1751 ? ?

## 2022-03-07 NOTE — Discharge Instructions (Addendum)
OTC Pain Meds: ?Tylenol 1000mg  every 6 hours ?Ibuprofen 400mg  every 6 hours ?I recommend alternating these medicines every 3 hours. ? ?Constipation: ?Miralax - mix 8 caps in 64 oz of gatorade/water ?Do this for back to back days or until stools have started ?Decrease to 1 cap daily for a week thereafter ? ?Heartburn: ?Continue the protonix ?Try Mylanta or Maalox or Carafate ? ? ?

## 2022-03-07 NOTE — ED Provider Triage Note (Signed)
Emergency Medicine Provider Triage Evaluation Note ? ?Jim Smith , a 56 y.o. male  was evaluated in triage.  Pt complains of groin pain.  Patient states he has had known bilateral inguinal hernias.  He states that over the past 2 days he has had significant increase in size of the right inguinal hernia associated with worsening pain.  He is unable to reduce that and has had spreading pain into his lower abdomen.  He states he has not had a bowel movement in 1 week.  He denies fevers.  Denies nausea or vomiting. ? ?Review of Systems  ?Positive: The above ?Negative: ? ?Physical Exam  ?BP (!) 130/113 (BP Location: Right Arm)   Pulse 75   Temp 98 ?F (36.7 ?C) (Oral)   Resp 16   Ht 6\' 2"  (1.88 m)   Wt 100 kg   SpO2 98%   BMI 28.31 kg/m?  ?Gen:   Awake, appears uncomfortable ?Resp:  Normal effort  ?MSK:   Moves extremities without difficulty  ?Other:  Chaperone present : bilateral inguinal hernias right> left.  Unable to manually reduce at bedside.  He has tenderness to palpation.  It is not red, warm ? ?Medical Decision Making  ?Medically screening exam initiated at 4:05 PM.  Appropriate orders placed.  Toure Edmonds was informed that the remainder of the evaluation will be completed by another provider, this initial triage assessment does not replace that evaluation, and the importance of remaining in the ED until their evaluation is complete. ? ? ?  ?Jim Olszewski, PA-C ?03/07/22 1607 ? ?

## 2022-06-08 IMAGING — CR DG CHEST 2V
4 series · 4 of 4 positions shown · non-contrast
Comparison: 07/04/2021

CLINICAL DATA: Left-sided chest burning beginning this morning.

EXAM:
CHEST - 2 VIEW

[chest lat (1 of 2)]
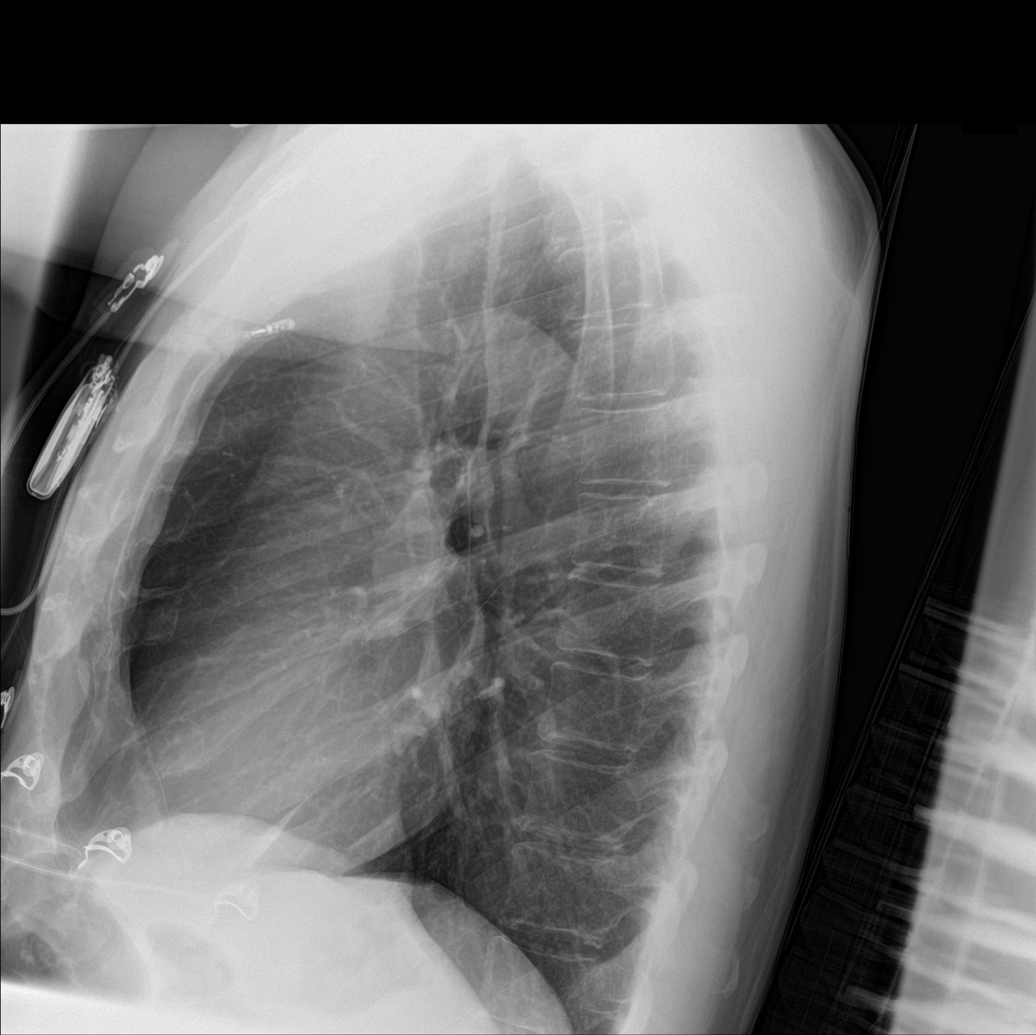

[chest ap (1 of 2)]
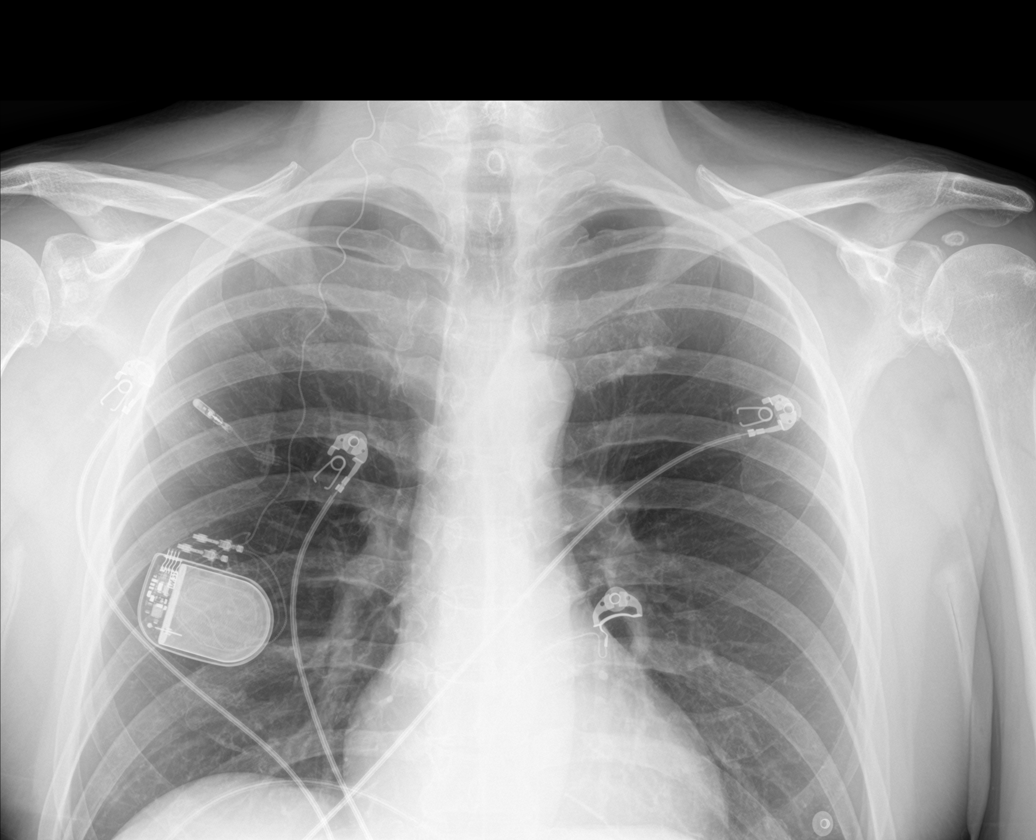

[chest ap (2 of 2)]
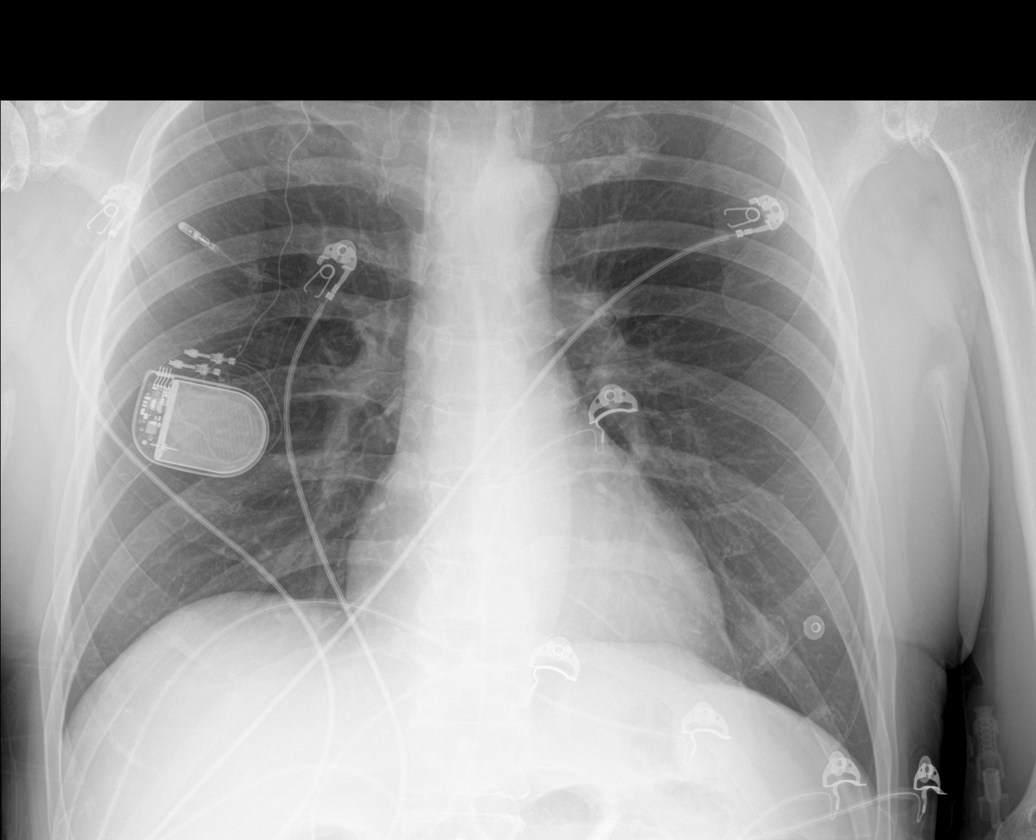

[chest lat (2 of 2)]
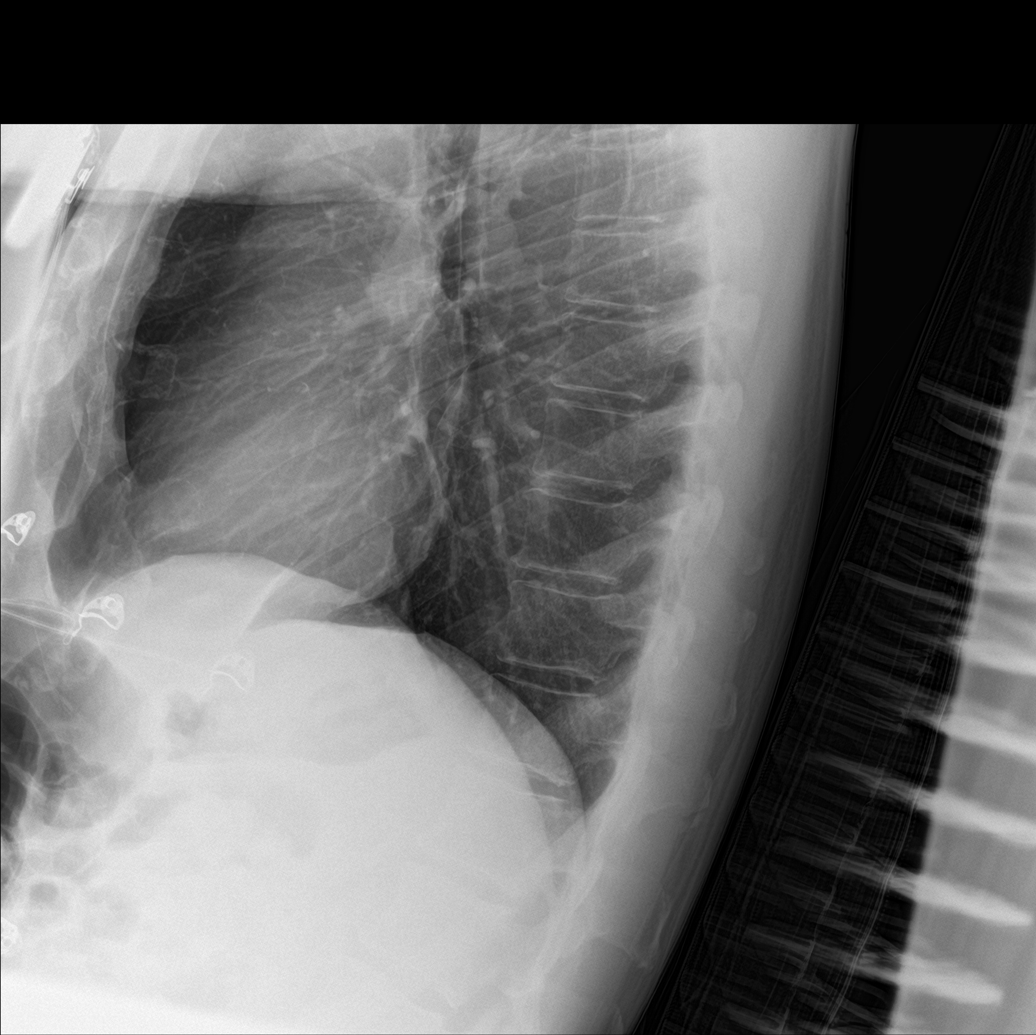

[4 of 4 positions shown; findings below may reference images not displayed]

FINDINGS: Cardiac silhouette is normal in size and configuration. Normal
mediastinal and hilar contours.

Clear lungs. No pleural effusion or pneumothorax. Stable right
anterior chest wall nerve stimulator.

Skeletal structures are intact.
IMPRESSION: No active cardiopulmonary disease.

## 2022-08-15 ENCOUNTER — Other Ambulatory Visit: Payer: Self-pay | Admitting: Neurosurgery

## 2022-08-15 ENCOUNTER — Other Ambulatory Visit (HOSPITAL_COMMUNITY): Payer: Self-pay | Admitting: Neurosurgery

## 2022-08-15 DIAGNOSIS — M5126 Other intervertebral disc displacement, lumbar region: Secondary | ICD-10-CM

## 2022-09-16 ENCOUNTER — Ambulatory Visit (HOSPITAL_COMMUNITY)
Admission: RE | Admit: 2022-09-16 | Discharge: 2022-09-16 | Disposition: A | Discharge status: Home / Self Care | Payer: Commercial Managed Care - PPO | Source: Ambulatory Visit | Attending: Neurosurgery | Admitting: Neurosurgery

## 2022-09-16 DIAGNOSIS — M5126 Other intervertebral disc displacement, lumbar region: Secondary | ICD-10-CM | POA: Diagnosis not present

## 2022-09-16 MED ORDER — GADOPICLENOL 0.5 MMOL/ML IV SOLN
10.0000 mL | Freq: Once | INTRAVENOUS | Status: AC | PRN
  Administered 2022-09-16: 10 mL via INTRAVENOUS

## 2023-08-07 ENCOUNTER — Ambulatory Visit: Payer: Self-pay | Admitting: Surgery

## 2023-08-07 NOTE — Patient Instructions (Signed)
DUE TO COVID-19 ONLY TWO VISITORS  (aged 58 and older)  ARE ALLOWED TO COME WITH YOU AND STAY IN THE WAITING ROOM ONLY DURING PRE OP AND PROCEDURE.   **NO VISITORS ARE ALLOWED IN THE SHORT STAY AREA OR RECOVERY ROOM!!**  IF YOU WILL BE ADMITTED INTO THE HOSPITAL YOU ARE ALLOWED ONLY FOUR SUPPORT PEOPLE DURING VISITATION HOURS ONLY (7 AM -8PM)   The support person(s) must pass our screening, gel in and out, and wear a mask at all times, including in the patient's room. Patients must also wear a mask when staff or their support person are in the room. Visitors GUEST BADGE MUST BE WORN VISIBLY  One adult visitor may remain with you overnight and MUST be in the room by 8 P.M.     Your procedure is scheduled on: 08/25/23   Report to [facility] Main Entrance    Report to admitting at : 7:45 AM   Call this number if you have problems the morning of surgery [phone]   Eat a light diet the day before surgery.  Examples including soups, broths, toast, yogurt, mashed potatoes.  Things to avoid include carbonated beverages (fizzy beverages), raw fruits and raw vegetables, or beans.   If your bowels are filled with gas, your surgeon will have difficulty visualizing your pelvic organs which increases your surgical risks. D o not eat food :After Midnight.   After Midnight you may have the following liquids until: 7:00 AM DAY OF SURGERY  Water Black Coffee (sugar ok, NO MILK/CREAM OR CREAMERS)  Tea (sugar ok, NO MILK/CREAM OR CREAMERS) regular and decaf                             Plain Jell-O (NO RED)                                           Fruit ices (not with fruit pulp, NO RED)                                     Popsicles (NO RED)                                                                  Juice: apple, WHITE grape, WHITE cranberry Sports drinks like Gatorade (NO RED)              FOLLOW ANY ADDITIONAL PRE OP INSTRUCTIONS YOU RECEIVED FROM YOUR SURGEON'S OFFICE!!!   Oral  Hygiene is also important to reduce your risk of infection.                                    Remember - BRUSH YOUR TEETH THE MORNING OF SURGERY WITH YOUR REGULAR TOOTHPASTE  DENTURES WILL BE REMOVED PRIOR TO SURGERY PLEASE DO NOT APPLY "Poly grip" OR ADHESIVES!!!   Do NOT smoke after Midnight   Take these medicines the morning of surgery with A SIP OF WATER:  bupropion,sertraline,propranolol,pantoprazole,oxybutynin.  Bring CPAP mask and tubing day of surgery.                              You may not have any metal on your body including hair pins, jewelry, and body piercing              Men may shave face and neck.   Do not bring valuables to the hospital. [facility] IS NOT             RESPONSIBLE   FOR VALUABLES.   Contacts, glasses, or bridgework may not be worn into surgery.   Bring small overnight bag day of surgery.   DO NOT BRING YOUR HOME MEDICATIONS TO THE HOSPITAL. PHARMACY WILL DISPENSE MEDICATIONS LISTED ON YOUR MEDICATION LIST TO YOU DURING YOUR ADMISSION IN THE HOSPITAL!    Patients discharged on the day of surgery will not be allowed to drive home.  Someone NEEDS to stay with you for the first 24 hours after anesthesia.   Special Instructions: Bring a copy of your healthcare power of attorney and living will documents         the day of surgery if you haven't scanned them before.              Please read over the following fact sheets you were given: IF YOU HAVE QUESTIONS ABOUT YOUR PRE-OP INSTRUCTIONS PLEASE CALL [phone]    [facility] - Preparing for Surgery Before surgery, you can play an important role.  Because skin is not sterile, your skin needs to be as free of germs as possible.  You can reduce the number of germs on your skin by washing with CHG (chlorahexidine gluconate) soap before surgery.  CHG is an antiseptic cleaner which kills germs and bonds with the skin to continue killing germs even after washing. Please DO NOT use if you have an allergy to  CHG or antibacterial soaps.  If your skin becomes reddened/irritated stop using the CHG and inform your nurse when you arrive at Short Stay. Do not shave (including legs and underarms) for at least 48 hours prior to the first CHG shower.  You may shave your face/neck. Please follow these instructions carefully:  1.  Shower with CHG Soap the night before surgery and the  morning of Surgery.  2.  If you choose to wash your hair, wash your hair first as usual with your  normal  shampoo.  3.  After you shampoo, rinse your hair and body thoroughly to remove the  shampoo.                           4.  Use CHG as you would any other liquid soap.  You can apply chg directly  to the skin and wash                       Gently with a scrungie or clean washcloth.  5.  Apply the CHG Soap to your body ONLY FROM THE NECK DOWN.   Do not use on face/ open                           Wound or open sores. Avoid contact with eyes, ears mouth and genitals (private parts).  Wash face,  Genitals (private parts) with your normal soap.             6.  Wash thoroughly, paying special attention to the area where your surgery  will be performed.  7.  Thoroughly rinse your body with warm water from the neck down.  8.  DO NOT shower/wash with your normal soap after using and rinsing off  the CHG Soap.                9.  Pat yourself dry with a clean towel.            10.  Wear clean pajamas.            11.  Place clean sheets on your bed the night of your first shower and do not  sleep with pets. Day of Surgery : Do not apply any lotions/deodorants the morning of surgery.  Please wear clean clothes to the hospital/surgery center.  FAILURE TO FOLLOW THESE INSTRUCTIONS MAY RESULT IN THE CANCELLATION OF YOUR SURGERY PATIENT SIGNATURE_________________________________  NURSE SIGNATURE__________________________________  ________________________________________________________________________

## 2023-08-07 NOTE — Progress Notes (Signed)
Pt. Needs orders for upcoming surgery. ?

## 2023-08-08 ENCOUNTER — Encounter (HOSPITAL_COMMUNITY)
Admission: RE | Admit: 2023-08-08 | Discharge: 2023-08-08 | Disposition: A | Discharge status: Home / Self Care | Payer: Commercial Managed Care - PPO | Source: Ambulatory Visit | Attending: Anesthesiology | Admitting: Anesthesiology

## 2023-08-08 DIAGNOSIS — I2510 Atherosclerotic heart disease of native coronary artery without angina pectoris: Secondary | ICD-10-CM

## 2023-08-14 ENCOUNTER — Ambulatory Visit
Admission: RE | Admit: 2023-08-14 | Discharge: 2023-08-14 | Disposition: A | Discharge status: Home / Self Care | Payer: Commercial Managed Care - PPO | Source: Ambulatory Visit | Attending: Physical Medicine and Rehabilitation | Admitting: Physical Medicine and Rehabilitation

## 2023-08-14 ENCOUNTER — Other Ambulatory Visit: Payer: Self-pay | Admitting: Physical Medicine and Rehabilitation

## 2023-08-14 DIAGNOSIS — M542 Cervicalgia: Secondary | ICD-10-CM

## 2023-08-25 ENCOUNTER — Ambulatory Visit: Admit: 2023-08-25 | Payer: Commercial Managed Care - PPO | Admitting: Surgery

## 2023-08-25 SURGERY — REPAIR, HERNIA, INGUINAL, LAPAROSCOPIC
Anesthesia: General | Laterality: Bilateral

## 2023-09-02 ENCOUNTER — Other Ambulatory Visit: Payer: Self-pay | Admitting: Orthopedic Surgery

## 2023-09-02 DIAGNOSIS — M542 Cervicalgia: Secondary | ICD-10-CM

## 2023-09-03 NOTE — Discharge Instructions (Signed)

## 2023-09-04 ENCOUNTER — Ambulatory Visit
Admission: RE | Admit: 2023-09-04 | Discharge: 2023-09-04 | Disposition: A | Discharge status: Home / Self Care | Payer: Commercial Managed Care - PPO | Source: Ambulatory Visit | Attending: Orthopedic Surgery | Admitting: Orthopedic Surgery

## 2023-09-04 DIAGNOSIS — M542 Cervicalgia: Secondary | ICD-10-CM

## 2023-09-04 MED ORDER — ONDANSETRON HCL 4 MG/2ML IJ SOLN: 4.0000 mg | Freq: Once | INTRAMUSCULAR | Status: DC | PRN

## 2023-09-04 MED ORDER — DIAZEPAM 5 MG PO TABS: 10.0000 mg | ORAL_TABLET | Freq: Once | ORAL | Status: DC

## 2023-09-04 MED ORDER — MEPERIDINE HCL 50 MG/ML IJ SOLN: 50.0000 mg | Freq: Once | INTRAMUSCULAR | Status: DC | PRN

## 2023-09-04 MED ORDER — IOPAMIDOL (ISOVUE-M 300) INJECTION 61%
10.0000 mL | Freq: Once | INTRAMUSCULAR | Status: AC | PRN
  Administered 2023-09-04: 10 mL via INTRATHECAL

## 2023-09-15 ENCOUNTER — Other Ambulatory Visit: Payer: Self-pay | Admitting: Orthopedic Surgery

## 2023-09-17 NOTE — Progress Notes (Signed)
Surgical Instructions    Your procedure is scheduled on Wednesday October 01, 2023.  Report to [facility] Main Entrance "A" at 11:30 A.M., then check in with the Admitting office.  Call this number if you have problems the morning of surgery:  [phone]   If you have any questions prior to your surgery date call [phone]: Open Monday-Friday 8am-4pm If you experience any cold or flu symptoms such as cough, fever, chills, shortness of breath, etc. between now and your scheduled surgery, please notify us at the above number     Remember:  Do not eat after midnight the night before your surgery.  You may drink clear liquids until 10:30 the morning of your surgery.   Clear liquids allowed are: Water, Non-Citrus Juices (without pulp), Carbonated Beverages, Clear Tea, Black Coffee ONLY (NO MILK, CREAM OR POWDERED CREAMER of any kind), and Gatorade.    Enhanced Recovery after Surgery for Orthopedics Enhanced Recovery after Surgery is a protocol used to improve the stress on your body and your recovery after surgery.  Patient Instructions  The day of surgery (if you do NOT have diabetes):  Drink ONE (1) Pre-Surgery Clear Ensure by 10:30 am the morning of surgery   This drink was given to you during your hospital  pre-op appointment visit. Nothing else to drink after completing the  Pre-Surgery Clear Ensure.         If you have questions, please contact your surgeon's office.    Take these medicines the morning of surgery with A SIP OF WATER:  buPROPion (WELLBUTRIN SR)  oxybutynin (DITROPAN-XL)  pantoprazole (PROTONIX)  propranolol ER (INDERAL LA)  sertraline (ZOLOFT)   If needed:  acetaminophen (TYLENOL)  ondansetron (ZOFRAN ODT)   As of today, STOP taking any Aspirin (unless otherwise instructed by your surgeon) Aleve, Naproxen, Ibuprofen, Motrin, Advil, Goody's, BC's, all herbal medications, fish oil, and all vitamins.  Special instructions:    Oral Hygiene is also  important to reduce your risk of infection.  Remember - BRUSH YOUR TEETH THE MORNING OF SURGERY WITH YOUR REGULAR TOOTHPASTE    Pre-operative 5 CHG Bath Instructions   You can play a key role in reducing the risk of infection after surgery. Your skin needs to be as free of germs as possible. You can reduce the number of germs on your skin by washing with CHG (chlorhexidine gluconate) soap before surgery. CHG is an antiseptic soap that kills germs and continues to kill germs even after washing.   DO NOT use if you have an allergy to chlorhexidine/CHG or antibacterial soaps. If your skin becomes reddened or irritated, stop using the CHG and notify one of our RNs at [phone].   Please shower with the CHG soap starting 4 days before surgery using the following schedule:     Please keep in mind the following:  DO NOT shave, including legs and underarms, starting the day of your first shower.   You may shave your face at any point before/day of surgery.  Place clean sheets on your bed the day you start using CHG soap. Use a clean washcloth (not used since being washed) for each shower. DO NOT sleep with pets once you start using the CHG.   CHG Shower Instructions:  If you choose to wash your hair and private area, wash first with your normal shampoo/soap.  After you use shampoo/soap, rinse your hair and body thoroughly to remove shampoo/soap residue.  Turn the water OFF and apply about 3 tablespoons (  45 ml) of CHG soap to a CLEAN washcloth.  Apply CHG soap ONLY FROM YOUR NECK DOWN TO YOUR TOES (washing for 3-5 minutes)  DO NOT use CHG soap on face, private areas, open wounds, or sores.  Pay special attention to the area where your surgery is being performed.  If you are having back surgery, having someone wash your back for you may be helpful. Wait 2 minutes after CHG soap is applied, then you may rinse off the CHG soap.  Pat dry with a clean towel  Put on clean clothes/pajamas   If you  choose to wear lotion, please use ONLY the CHG-compatible lotions on the back of this paper.     Additional instructions for the day of surgery: DO NOT APPLY any lotions, deodorants, cologne, or perfumes.   Put on clean/comfortable clothes.  Brush your teeth.  Ask your nurse before applying any prescription medications to the skin.      CHG Compatible Lotions   Aveeno Moisturizing lotion  Cetaphil Moisturizing Cream  Cetaphil Moisturizing Lotion  Clairol Herbal Essence Moisturizing Lotion, Dry Skin  Clairol Herbal Essence Moisturizing Lotion, Extra Dry Skin  Clairol Herbal Essence Moisturizing Lotion, Normal Skin  Curel Age Defying Therapeutic Moisturizing Lotion with Alpha Hydroxy  Curel Extreme Care Body Lotion  Curel Soothing Hands Moisturizing Hand Lotion  Curel Therapeutic Moisturizing Cream, Fragrance-Free  Curel Therapeutic Moisturizing Lotion, Fragrance-Free  Curel Therapeutic Moisturizing Lotion, Original Formula  Eucerin Daily Replenishing Lotion  Eucerin Dry Skin Therapy Plus Alpha Hydroxy Crme  Eucerin Dry Skin Therapy Plus Alpha Hydroxy Lotion  Eucerin Original Crme  Eucerin Original Lotion  Eucerin Plus Crme Eucerin Plus Lotion  Eucerin TriLipid Replenishing Lotion  Keri Anti-Bacterial Hand Lotion  Keri Deep Conditioning Original Lotion Dry Skin Formula Softly Scented  Keri Deep Conditioning Original Lotion, Fragrance Free Sensitive Skin Formula  Keri Lotion Fast Absorbing Fragrance Free Sensitive Skin Formula  Keri Lotion Fast Absorbing Softly Scented Dry Skin Formula  Keri Original Lotion  Keri Skin Renewal Lotion Keri Silky Smooth Lotion  Keri Silky Smooth Sensitive Skin Lotion  Nivea Body Creamy Conditioning Oil  Nivea Body Extra Enriched Lotion  Nivea Body Original Lotion  Nivea Body Sheer Moisturizing Lotion Nivea Crme  Nivea Skin Firming Lotion  NutraDerm 30 Skin Lotion  NutraDerm Skin Lotion  NutraDerm Therapeutic Skin Cream  NutraDerm  Therapeutic Skin Lotion  ProShield Protective Hand Cream  Provon moisturizing lotion    Day of Surgery:  Take a shower with CHG soap. Wear Clean/Comfortable clothing the morning of surgery Do not apply any deodorants/lotions.   Remember to brush your teeth WITH YOUR REGULAR TOOTHPASTE.  Do not wear jewelry or makeup. Do not wear lotions, powders, perfumes/cologne or deodorant. Do not shave 48 hours prior to surgery.  Men may shave face and neck. Do not bring valuables to the hospital. Do not wear nail polish, gel polish, artificial nails, or any other type of covering on natural nails (fingers and toes) If you have artificial nails or gel coating that need to be removed by a nail salon, please have this removed prior to surgery. Artificial nails or gel coating may interfere with anesthesia's ability to adequately monitor your vital signs.  [facility] is not responsible for any belongings or valuables.    Do NOT Smoke (Tobacco/Vaping)  24 hours prior to your procedure  If you use a CPAP at night, you may bring your mask for your overnight stay.   Contacts, glasses, hearing aids, dentures  or partials may not be worn into surgery, please bring cases for these belongings   For patients admitted to the hospital, discharge time will be determined by your treatment team.   Patients discharged the day of surgery will not be allowed to drive home, and someone needs to stay with them for 24 hours.   SURGICAL WAITING ROOM VISITATION Patients having surgery or a procedure may have no more than 2 support people in the waiting area - these visitors may rotate.   Children under the age of 3 must have an adult with them who is not the patient. If the patient needs to stay at the hospital during part of their recovery, the visitor guidelines for inpatient rooms apply. Pre-op nurse will coordinate an appropriate time for 1 support person to accompany patient in pre-op.  This support person may  not rotate.   Please refer to [email or web address] for the visitor guidelines for Inpatients (after your surgery is over and you are in a regular room).   If you received a COVID test during your pre-op visit, it is requested that you wear a mask when out in public, stay away from anyone that may not be feeling well, and notify your surgeon if you develop symptoms. If you have been in contact with anyone that has tested positive in the last 10 days, please notify your surgeon.    Please read over the following fact sheets that you were given.

## 2023-09-18 ENCOUNTER — Other Ambulatory Visit: Payer: Self-pay

## 2023-09-18 ENCOUNTER — Encounter (HOSPITAL_COMMUNITY)
Admission: RE | Admit: 2023-09-18 | Discharge: 2023-09-18 | Disposition: A | Discharge status: Home / Self Care | Payer: Commercial Managed Care - PPO | Source: Ambulatory Visit | Attending: Orthopedic Surgery | Admitting: Orthopedic Surgery

## 2023-09-18 ENCOUNTER — Encounter (HOSPITAL_COMMUNITY): Payer: Self-pay

## 2023-09-18 VITALS — BP 128/79 | HR 66 | Temp 98.0°F | Resp 18 | Ht 74.0 in | Wt 181.8 lb

## 2023-09-18 DIAGNOSIS — Z01818 Encounter for other preprocedural examination: Secondary | ICD-10-CM | POA: Diagnosis present

## 2023-09-18 DIAGNOSIS — F419 Anxiety disorder, unspecified: Secondary | ICD-10-CM

## 2023-09-18 HISTORY — DX: Depression, unspecified: F32.A

## 2023-09-18 LAB — BASIC METABOLIC PANEL
Anion gap: 10 (ref 5–15)
BUN: 16 mg/dL (ref 6–20)
CO2: 26 mmol/L (ref 22–32)
Calcium: 8.8 mg/dL — ABNORMAL LOW (ref 8.9–10.3)
Chloride: 97 mmol/L — ABNORMAL LOW (ref 98–111)
Creatinine, Ser: 0.96 mg/dL (ref 0.61–1.24)
GFR, Estimated: >60 mL/min (ref >60)
Glucose, Bld: 78 mg/dL (ref 70–99)
Potassium: 4 mmol/L (ref 3.5–5.1)
Sodium: 133 mmol/L — ABNORMAL LOW (ref 135–145)

## 2023-09-18 LAB — SURGICAL PCR SCREEN
MRSA, PCR: NEGATIVE
Staphylococcus aureus: POSITIVE — AB

## 2023-09-18 LAB — CBC
HCT: 43.4 % (ref 39.0–52.0)
Hemoglobin: 14.3 g/dL (ref 13.0–17.0)
MCH: 31.4 pg (ref 26.0–34.0)
MCHC: 32.9 g/dL (ref 30.0–36.0)
MCV: 95.4 fL (ref 80.0–100.0)
Platelets: 278 10*3/uL (ref 150–400)
RBC: 4.55 MIL/uL (ref 4.22–5.81)
RDW: 13.1 % (ref 11.5–15.5)
WBC: 10.1 10*3/uL (ref 4.0–10.5)
nRBC: 0 % (ref 0.0–0.2)

## 2023-09-18 NOTE — Progress Notes (Signed)
PCP - Brett Fairy Cardiologist - denies  PPM/ICD - Denies Device Orders - n/a Rep Notified - n/a   Chest x-ray - 03-03-22 EKG - 09-18-23 Stress Test - denies ECHO - denies Cardiac Cath - denies  Sleep Study - 05-21-19 CPAP - Patient has a inspire   DM Denies  Blood Thinner Instructions:denies Aspirin Instructions:n/a  ERAS Protcol - with drink PRE-SURGERY Ensure    COVID TEST- n/a   Anesthesia review: no  Patient denies shortness of breath, fever, cough and chest pain at PAT appointment   All instructions explained to the patient, with a verbal understanding of the material. Patient agrees to go over the instructions while at home for a better understanding. Patient also instructed to self quarantine after being tested for COVID-19. The opportunity to ask questions was provided.

## 2023-10-01 ENCOUNTER — Other Ambulatory Visit: Payer: Self-pay

## 2023-10-01 ENCOUNTER — Ambulatory Visit (HOSPITAL_COMMUNITY): Payer: Commercial Managed Care - PPO

## 2023-10-01 ENCOUNTER — Ambulatory Visit (HOSPITAL_COMMUNITY): Payer: Commercial Managed Care - PPO | Admitting: Certified Registered Nurse Anesthetist

## 2023-10-01 ENCOUNTER — Encounter (HOSPITAL_COMMUNITY): Payer: Self-pay | Admitting: Orthopedic Surgery

## 2023-10-01 ENCOUNTER — Encounter (HOSPITAL_COMMUNITY)
Admission: RE | Disposition: A | Discharge status: Home / Self Care | Payer: Self-pay | Source: Home / Self Care | Attending: Orthopedic Surgery

## 2023-10-01 ENCOUNTER — Ambulatory Visit (HOSPITAL_COMMUNITY)
Admission: RE | Admit: 2023-10-01 | Discharge: 2023-10-01 | Disposition: A | Discharge status: Home / Self Care | Payer: Commercial Managed Care - PPO | Attending: Orthopedic Surgery | Admitting: Orthopedic Surgery

## 2023-10-01 ENCOUNTER — Ambulatory Visit (HOSPITAL_BASED_OUTPATIENT_CLINIC_OR_DEPARTMENT_OTHER): Payer: Commercial Managed Care - PPO | Admitting: Certified Registered Nurse Anesthetist

## 2023-10-01 DIAGNOSIS — I10 Essential (primary) hypertension: Secondary | ICD-10-CM | POA: Insufficient documentation

## 2023-10-01 DIAGNOSIS — K219 Gastro-esophageal reflux disease without esophagitis: Secondary | ICD-10-CM | POA: Diagnosis not present

## 2023-10-01 DIAGNOSIS — F419 Anxiety disorder, unspecified: Secondary | ICD-10-CM | POA: Insufficient documentation

## 2023-10-01 DIAGNOSIS — F32A Depression, unspecified: Secondary | ICD-10-CM | POA: Diagnosis not present

## 2023-10-01 DIAGNOSIS — M5412 Radiculopathy, cervical region: Secondary | ICD-10-CM | POA: Diagnosis not present

## 2023-10-01 DIAGNOSIS — G4730 Sleep apnea, unspecified: Secondary | ICD-10-CM | POA: Diagnosis not present

## 2023-10-01 DIAGNOSIS — G43909 Migraine, unspecified, not intractable, without status migrainosus: Secondary | ICD-10-CM | POA: Diagnosis not present

## 2023-10-01 DIAGNOSIS — M4802 Spinal stenosis, cervical region: Secondary | ICD-10-CM | POA: Diagnosis present

## 2023-10-01 HISTORY — PX: ANTERIOR CERVICAL DECOMP/DISCECTOMY FUSION: SHX1161

## 2023-10-01 SURGERY — ANTERIOR CERVICAL DECOMPRESSION/DISCECTOMY FUSION 2 LEVELS
Anesthesia: General

## 2023-10-01 MED ORDER — OXYCODONE-ACETAMINOPHEN 5-325 MG PO TABS
1.0000 | ORAL_TABLET | ORAL | Status: DC | PRN
Start: 2023-10-01 — End: 2023-10-01

## 2023-10-01 MED ORDER — THROMBIN 20000 UNITS EX SOLR
CUTANEOUS | Status: DC | PRN
  Administered 2023-10-01: 20 mL via TOPICAL

## 2023-10-01 MED ORDER — ACETAMINOPHEN 10 MG/ML IV SOLN
INTRAVENOUS | Status: AC
  Filled 2023-10-01: qty 100

## 2023-10-01 MED ORDER — CEFAZOLIN SODIUM-DEXTROSE 2-4 GM/100ML-% IV SOLN
2.0000 g | INTRAVENOUS | Status: AC
  Administered 2023-10-01: 2 g via INTRAVENOUS

## 2023-10-01 MED ORDER — LACTATED RINGERS IV SOLN: INTRAVENOUS | Status: DC

## 2023-10-01 MED ORDER — ONDANSETRON HCL 4 MG/2ML IJ SOLN
INTRAMUSCULAR | Status: DC | PRN
  Administered 2023-10-01: 4 mg via INTRAVENOUS

## 2023-10-01 MED ORDER — CHLORHEXIDINE GLUCONATE CLOTH 2 % EX PADS: 6.0000 | MEDICATED_PAD | Freq: Every day | CUTANEOUS | Status: DC

## 2023-10-01 MED ORDER — PROPOFOL 10 MG/ML IV BOLUS
INTRAVENOUS | Status: DC | PRN
Start: 2023-10-01 — End: 2023-10-01
  Administered 2023-10-01: 150 mg via INTRAVENOUS

## 2023-10-01 MED ORDER — MIDAZOLAM HCL 2 MG/2ML IJ SOLN
INTRAMUSCULAR | Status: AC
  Filled 2023-10-01: qty 2

## 2023-10-01 MED ORDER — 0.9 % SODIUM CHLORIDE (POUR BTL) OPTIME
TOPICAL | Status: DC | PRN
  Administered 2023-10-01: 1000 mL

## 2023-10-01 MED ORDER — LIDOCAINE 2% (20 MG/ML) 5 ML SYRINGE
INTRAMUSCULAR | Status: AC
  Filled 2023-10-01: qty 5

## 2023-10-01 MED ORDER — THROMBIN 20000 UNITS EX SOLR
CUTANEOUS | Status: AC
  Filled 2023-10-01: qty 20000

## 2023-10-01 MED ORDER — OXYCODONE HCL 5 MG PO TABS
5.0000 mg | ORAL_TABLET | Freq: Once | ORAL | Status: AC | PRN
  Administered 2023-10-01: 5 mg via ORAL

## 2023-10-01 MED ORDER — PROPOFOL 10 MG/ML IV BOLUS
INTRAVENOUS | Status: AC
  Filled 2023-10-01: qty 20

## 2023-10-01 MED ORDER — GLYCOPYRROLATE 0.2 MG/ML IJ SOLN
INTRAMUSCULAR | Status: DC | PRN
Start: 2023-10-01 — End: 2023-10-01
  Administered 2023-10-01: .2 mg via INTRAVENOUS

## 2023-10-01 MED ORDER — BUPIVACAINE-EPINEPHRINE 0.25% -1:200000 IJ SOLN
INTRAMUSCULAR | Status: DC | PRN
  Administered 2023-10-01: 6 mL

## 2023-10-01 MED ORDER — MUPIROCIN 2 % EX OINT
1.0000 | TOPICAL_OINTMENT | Freq: Two times a day (BID) | CUTANEOUS | Status: DC
  Filled 2023-10-01: qty 22

## 2023-10-01 MED ORDER — ROCURONIUM BROMIDE 10 MG/ML (PF) SYRINGE
PREFILLED_SYRINGE | INTRAVENOUS | Status: DC | PRN
  Administered 2023-10-01: 20 mg via INTRAVENOUS
  Administered 2023-10-01: 70 mg via INTRAVENOUS

## 2023-10-01 MED ORDER — DROPERIDOL 2.5 MG/ML IJ SOLN: 0.6250 mg | Freq: Once | INTRAMUSCULAR | Status: DC | PRN

## 2023-10-01 MED ORDER — OXYCODONE HCL 5 MG/5ML PO SOLN: 5.0000 mg | Freq: Once | ORAL | Status: AC | PRN

## 2023-10-01 MED ORDER — MIDAZOLAM HCL 2 MG/2ML IJ SOLN
INTRAMUSCULAR | Status: DC | PRN
  Administered 2023-10-01: 2 mg via INTRAVENOUS

## 2023-10-01 MED ORDER — CHLORHEXIDINE GLUCONATE 0.12 % MT SOLN: 15.0000 mL | Freq: Once | OROMUCOSAL | Status: AC

## 2023-10-01 MED ORDER — ROCURONIUM BROMIDE 10 MG/ML (PF) SYRINGE
PREFILLED_SYRINGE | INTRAVENOUS | Status: AC
  Filled 2023-10-01: qty 10

## 2023-10-01 MED ORDER — FENTANYL CITRATE (PF) 100 MCG/2ML IJ SOLN
INTRAMUSCULAR | Status: AC
  Filled 2023-10-01: qty 2

## 2023-10-01 MED ORDER — FENTANYL CITRATE (PF) 250 MCG/5ML IJ SOLN
INTRAMUSCULAR | Status: DC | PRN
  Administered 2023-10-01: 150 ug via INTRAVENOUS

## 2023-10-01 MED ORDER — ACETAMINOPHEN 10 MG/ML IV SOLN
1000.0000 mg | Freq: Once | INTRAVENOUS | Status: DC
  Administered 2023-10-01: 1000 mg via INTRAVENOUS

## 2023-10-01 MED ORDER — LIDOCAINE 2% (20 MG/ML) 5 ML SYRINGE
INTRAMUSCULAR | Status: DC | PRN
  Administered 2023-10-01: 100 mg via INTRAVENOUS

## 2023-10-01 MED ORDER — ORAL CARE MOUTH RINSE: 15.0000 mL | Freq: Once | OROMUCOSAL | Status: AC

## 2023-10-01 MED ORDER — POVIDONE-IODINE 7.5 % EX SOLN
Freq: Once | CUTANEOUS | Status: DC
  Filled 2023-10-01: qty 118

## 2023-10-01 MED ORDER — OXYCODONE HCL 5 MG PO TABS
ORAL_TABLET | ORAL | Status: AC
  Filled 2023-10-01: qty 1

## 2023-10-01 MED ORDER — DEXAMETHASONE SODIUM PHOSPHATE 10 MG/ML IJ SOLN
INTRAMUSCULAR | Status: DC | PRN
  Administered 2023-10-01: 10 mg via INTRAVENOUS

## 2023-10-01 MED ORDER — FENTANYL CITRATE (PF) 100 MCG/2ML IJ SOLN
25.0000 ug | INTRAMUSCULAR | Status: DC | PRN
  Administered 2023-10-01: 50 ug via INTRAVENOUS

## 2023-10-01 MED ORDER — CHLORHEXIDINE GLUCONATE 0.12 % MT SOLN
OROMUCOSAL | Status: AC
  Administered 2023-10-01: 15 mL via OROMUCOSAL
  Filled 2023-10-01: qty 15

## 2023-10-01 MED ORDER — DEXAMETHASONE SODIUM PHOSPHATE 10 MG/ML IJ SOLN
INTRAMUSCULAR | Status: AC
  Filled 2023-10-01: qty 1

## 2023-10-01 MED ORDER — ONDANSETRON HCL 4 MG/2ML IJ SOLN
INTRAMUSCULAR | Status: AC
  Filled 2023-10-01: qty 2

## 2023-10-01 MED ORDER — BUPIVACAINE-EPINEPHRINE (PF) 0.25% -1:200000 IJ SOLN
INTRAMUSCULAR | Status: AC
  Filled 2023-10-01: qty 30

## 2023-10-01 MED ORDER — FENTANYL CITRATE (PF) 250 MCG/5ML IJ SOLN
INTRAMUSCULAR | Status: AC
  Filled 2023-10-01: qty 5

## 2023-10-01 MED ORDER — PHENYLEPHRINE HCL-NACL 20-0.9 MG/250ML-% IV SOLN
INTRAVENOUS | Status: DC | PRN
Start: 2023-10-01 — End: 2023-10-01
  Administered 2023-10-01: 80 ug via INTRAVENOUS
  Administered 2023-10-01: 40 ug/min via INTRAVENOUS
  Administered 2023-10-01 (×2): 160 ug via INTRAVENOUS

## 2023-10-01 MED ORDER — SUGAMMADEX SODIUM 200 MG/2ML IV SOLN
INTRAVENOUS | Status: DC | PRN
  Administered 2023-10-01: 200 mg via INTRAVENOUS

## 2023-10-01 MED ORDER — CEFAZOLIN SODIUM-DEXTROSE 2-4 GM/100ML-% IV SOLN
INTRAVENOUS | Status: AC
  Filled 2023-10-01: qty 100

## 2023-10-01 SURGICAL SUPPLY — 78 items
AGENT HMST KT MTR STRL THRMB (HEMOSTASIS)
APL SKNCLS STERI-STRIP NONHPOA (GAUZE/BANDAGES/DRESSINGS) ×1
BAG COUNTER SPONGE SURGICOUNT (BAG) ×1 IMPLANT
BAG SPNG CNTER NS LX DISP (BAG) ×1
BENZOIN TINCTURE PRP APPL 2/3 (GAUZE/BANDAGES/DRESSINGS) ×1 IMPLANT
BIT DRILL NEURO 2X3.1 SFT TUCH (MISCELLANEOUS) ×1 IMPLANT
BIT DRILL SRG 14X2.2XFLT CHK (BIT) IMPLANT
BIT DRL SRG 14X2.2XFLT CHK (BIT) ×1
BLADE CLIPPER SURG (BLADE) ×1 IMPLANT
BLADE SURG 15 STRL LF DISP TIS (BLADE) ×1 IMPLANT
BLADE SURG 15 STRL SS (BLADE) ×1
BONE VIVIGEN FORMABLE 1.3CC (Bone Implant) ×1 IMPLANT
COLLAR CERV LO CONTOUR FIRM DE (SOFTGOODS) IMPLANT
CORD BIPOLAR FORCEPS 12FT (ELECTRODE) ×1 IMPLANT
COVER SURGICAL LIGHT HANDLE (MISCELLANEOUS) ×1 IMPLANT
DRAIN JACKSON RD 7FR 3/32 (WOUND CARE) IMPLANT
DRAPE C-ARM 42X72 X-RAY (DRAPES) ×1 IMPLANT
DRAPE POUCH INSTRU U-SHP 10X18 (DRAPES) ×1 IMPLANT
DRAPE SURG 17X23 STRL (DRAPES) ×4 IMPLANT
DRILL BIT SKYLINE 14MM (BIT) ×1
DRILL NEURO 2X3.1 SOFT TOUCH (MISCELLANEOUS) ×1
DURAPREP 26ML APPLICATOR (WOUND CARE) ×1 IMPLANT
ELECT COATED BLADE 2.86 ST (ELECTRODE) ×1 IMPLANT
ELECT REM PT RETURN 9FT ADLT (ELECTROSURGICAL) ×1
ELECTRODE REM PT RTRN 9FT ADLT (ELECTROSURGICAL) ×1 IMPLANT
EVACUATOR SILICONE 100CC (DRAIN) IMPLANT
GAUZE 4X4 16PLY ~~LOC~~+RFID DBL (SPONGE) ×1 IMPLANT
GAUZE SPONGE 4X4 12PLY STRL (GAUZE/BANDAGES/DRESSINGS) ×1 IMPLANT
GLOVE BIO SURGEON STRL SZ 6.5 (GLOVE) ×1 IMPLANT
GLOVE BIO SURGEON STRL SZ8 (GLOVE) ×1 IMPLANT
GLOVE BIOGEL PI IND STRL 7.0 (GLOVE) ×2 IMPLANT
GLOVE BIOGEL PI IND STRL 8 (GLOVE) ×1 IMPLANT
GLOVE SURG ENC MOIS LTX SZ6.5 (GLOVE) ×1 IMPLANT
GOWN STRL REUS W/ TWL LRG LVL3 (GOWN DISPOSABLE) ×1 IMPLANT
GOWN STRL REUS W/ TWL XL LVL3 (GOWN DISPOSABLE) ×1 IMPLANT
GOWN STRL REUS W/TWL LRG LVL3 (GOWN DISPOSABLE) ×1
GOWN STRL REUS W/TWL XL LVL3 (GOWN DISPOSABLE) ×1
GRAFT BNE MATRIX VG FRMBL SM 1 (Bone Implant) IMPLANT
IMPL S ENDOSKEL TC 8MM ODEG (Orthopedic Implant) IMPLANT
IMPLANT S ENDOSKEL TC 8MM ODEG (Orthopedic Implant) ×1 IMPLANT
INTERLOCK LRDTC CRVCL VBR 6MM (Bone Implant) IMPLANT
IV CATH 14GX2 1/4 (CATHETERS) ×1 IMPLANT
KIT BASIN OR (CUSTOM PROCEDURE TRAY) ×1 IMPLANT
KIT TURNOVER KIT B (KITS) ×1 IMPLANT
LORDOTIC CERVICAL VBR 6MM SM (Bone Implant) ×1 IMPLANT
MANIFOLD NEPTUNE II (INSTRUMENTS) ×1 IMPLANT
NDL PRECISIONGLIDE 27X1.5 (NEEDLE) ×1 IMPLANT
NDL SPNL 20GX3.5 QUINCKE YW (NEEDLE) ×1 IMPLANT
NEEDLE PRECISIONGLIDE 27X1.5 (NEEDLE) ×1
NEEDLE SPNL 20GX3.5 QUINCKE YW (NEEDLE) ×1
NS IRRIG 1000ML POUR BTL (IV SOLUTION) ×1 IMPLANT
PACK ORTHO CERVICAL (CUSTOM PROCEDURE TRAY) ×1 IMPLANT
PAD ARMBOARD 7.5X6 YLW CONV (MISCELLANEOUS) ×2 IMPLANT
PATTIES SURGICAL .5 X.5 (GAUZE/BANDAGES/DRESSINGS) IMPLANT
PATTIES SURGICAL .5 X1 (DISPOSABLE) ×1 IMPLANT
PIN DISTRACTION 14 (PIN) IMPLANT
PLATE SKYLINE TWO LEVEL 36MM (Plate) IMPLANT
POSITIONER HEAD DONUT 9IN (MISCELLANEOUS) ×1 IMPLANT
SCREW RESCUE SKYLINE 16MM (Screw) IMPLANT
SCREW SKYLINE VAR OS 14MM (Screw) IMPLANT
SPIKE FLUID TRANSFER (MISCELLANEOUS) ×1 IMPLANT
SPONGE INTESTINAL PEANUT (DISPOSABLE) ×1 IMPLANT
SPONGE SURGIFOAM ABS GEL 100 (HEMOSTASIS) ×1 IMPLANT
STRIP CLOSURE SKIN 1/2X4 (GAUZE/BANDAGES/DRESSINGS) ×1 IMPLANT
SURGIFLO W/THROMBIN 8M KIT (HEMOSTASIS) IMPLANT
SUT MNCRL AB 4-0 PS2 18 (SUTURE) ×1 IMPLANT
SUT SILK 4 0 (SUTURE)
SUT SILK 4-0 18XBRD TIE 12 (SUTURE) IMPLANT
SUT VIC AB 2-0 CT2 18 VCP726D (SUTURE) ×1 IMPLANT
SYR BULB IRRIG 60ML STRL (SYRINGE) ×1 IMPLANT
SYR CONTROL 10ML LL (SYRINGE) ×3 IMPLANT
TAPE CLOTH 4X10 WHT NS (GAUZE/BANDAGES/DRESSINGS) ×1 IMPLANT
TAPE CLOTH SURG 4X10 WHT LF (GAUZE/BANDAGES/DRESSINGS) IMPLANT
TAPE UMBILICAL 1/8X30 (MISCELLANEOUS) ×2 IMPLANT
TOWEL GREEN STERILE (TOWEL DISPOSABLE) ×1 IMPLANT
TOWEL GREEN STERILE FF (TOWEL DISPOSABLE) ×1 IMPLANT
WATER STERILE IRR 1000ML POUR (IV SOLUTION) ×1 IMPLANT
YANKAUER SUCT BULB TIP NO VENT (SUCTIONS) ×1 IMPLANT

## 2023-10-01 NOTE — Transfer of Care (Signed)
Immediate Anesthesia Transfer of Care Note  Patient: Jim Smith  Procedure(s) Performed: ANTERIOR CERVICAL DECOMPRESSION FUSION CERVICAL 5- CERVICAL 6, CERVICAL 6- CERVICAL 7 WITH INSTRUMENTATION AND ALLOGRAFT  Patient Location: PACU  Anesthesia Type:General  Level of Consciousness: awake, alert , and oriented  Airway & Oxygen Therapy: Patient Spontanous Breathing  Post-op Assessment: Report given to RN and Post -op Vital signs reviewed and stable  Post vital signs: Reviewed and stable  Last Vitals:  Vitals Value Taken Time  BP 163/91 10/01/23 1745  Temp    Pulse 67 10/01/23 1745  Resp 19 10/01/23 1745  SpO2 98 % 10/01/23 1745  Vitals shown include unfiled device data.  Last Pain:  Vitals:   10/01/23 1218  TempSrc:   PainSc: 2          Complications: No notable events documented.

## 2023-10-01 NOTE — H&P (Signed)
PREOPERATIVE H&P  Chief Complaint: Left arm pain  HPI: Jim Smith is a 57 y.o. 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  Patient has failed multiple forms of conservative care and continues to have pain (see office notes for additional details regarding the patient's full course of treatment)  Past Medical History:  Diagnosis Date   Acute ear infection    Anxiety    Bronchitis    Depression    GERD (gastroesophageal reflux disease)    H/O removal of neck cyst 01/2012   R anterior neck I&D, with abx   Hyperlipidemia    Hypertension    Migraine    Sleep apnea    uses Bipap occaisionally   Past Surgical History:  Procedure Laterality Date   DRUG INDUCED ENDOSCOPY N/A 11/01/2020   Procedure: DRUG INDUCED ENDOSCOPY;  Surgeon: Christia Reading, MD;  Location: [facility];  Service: ENT;  Laterality: N/A;   FRACTURE SURGERY Left 2009   tibial fx   IMPLANTATION OF HYPOGLOSSAL NERVE STIMULATOR Right 07/04/2021   Procedure: IMPLANTATION OF HYPOGLOSSAL NERVE STIMULATOR;  Surgeon: Christia Reading, MD;  Location: [facility];  Service: ENT;  Laterality: Right;   PILONIDAL CYST EXCISION     SUBMANDIBULAR GLAND EXCISION Right 05/17/2016   Procedure: INTRA ORAL EXCISION RIGHT SUBLINGUAL GLAND;  Surgeon: Jim Colonel, MD;  Location: [facility];  Service: ENT;  Laterality: Right;   Social History   Socioeconomic History   Marital status: Married    Spouse name: Not on file   Number of children: 1   Years of education: Not on file   Highest education level: Not on file  Occupational History   Not on file  Tobacco Use   Smoking status: Never   Smokeless tobacco: Never  Vaping Use   Vaping status: Never Used  Substance and Sexual Activity   Alcohol use: No   Drug use: No   Sexual activity: Not on file  Other Topics Concern   Not on file   Social History Narrative   Lives home with wife, Jim Smith.  and daughter.  [employer],  Education BS [place].  One child.  Caffeine 2-3 drinks daily.     Social Determinants of Health   Financial Resource Strain: Not on file  Food Insecurity: Not on file  Transportation Needs: Not on file  Physical Activity: Not on file  Stress: Not on file  Social Connections: Unknown (09/30/2022)   [facility]   Social Connections    Frequency of Communication with Friends and Family: Not asked    Frequency of Social Gatherings with Friends and Family: Not asked   Family History  Problem Relation Age of Onset   Parkinson's disease Mother    Depression Father    Liver cancer Maternal Grandfather    Allergies  Allergen Reactions   Bactrim [Sulfamethoxazole-Trimethoprim] Rash   Buprenorphine Hcl Other (See Comments)    GI Intolerance   Voltaren [Diclofenac] Diarrhea and Other (See Comments)    GI upset   Dm-Doxylamine-Acetaminophen Rash   Morphine And Codeine Nausea And Vomiting    Patient denies allergy   Penicillins Rash   Tamiflu [Oseltamivir] Rash   Prior to Admission medications   Medication Sig Start Date End Date Taking? Authorizing Provider  acetaminophen (TYLENOL) 650 MG CR tablet Take 1,300 mg by mouth every  8 (eight) hours as needed for pain.   Yes [provider]  Ascorbic Acid (VITAMIN C PO) Take 1 tablet by mouth 2 (two) times a week. One to two times weekly   Yes [provider]  atorvastatin (LIPITOR) 10 MG tablet Take 10 mg by mouth at bedtime. 01/22/23  Yes [provider]  buPROPion (WELLBUTRIN SR) 200 MG 12 hr tablet Take 200 mg by mouth 2 (two) times daily. 12/15/15  Yes [provider]  Cholecalciferol (VITAMIN D-3 PO) Take 1 tablet by mouth 2 (two) times a week. One to two times weekly   Yes [provider]  clindamycin (CLEOCIN T) 1 % lotion Apply 1 Application topically daily as needed (skin  blemishes/acne).   Yes [provider]  Cyanocobalamin (VITAMIN B-12) 5000 MCG SUBL Take 5,000 mcg by mouth daily.   Yes [provider]  cyclobenzaprine (FLEXERIL) 10 MG tablet Take 10 mg by mouth at bedtime.   Yes [provider]  famotidine-calcium carbonate-magnesium hydroxide (PEPCID COMPLETE) 10-800-165 MG chewable tablet Chew 1 tablet by mouth daily as needed (acid reflux/indigestion.).   Yes [provider]  gabapentin (NEURONTIN) 300 MG capsule Take 300 mg by mouth at bedtime.   Yes [provider]  ibuprofen (ADVIL) 200 MG tablet Take 200-400 mg by mouth every 8 (eight) hours as needed (pain.).   Yes [provider]  montelukast (SINGULAIR) 10 MG tablet Take 10 mg by mouth at bedtime.   Yes [provider]  ondansetron (ZOFRAN ODT) 4 MG disintegrating tablet Take 1 tablet (4 mg total) by mouth every 8 (eight) hours as needed for nausea or vomiting. 09/24/20  Yes Avegno, Jim Dakins, FNP  oxybutynin (DITROPAN-XL) 10 MG 24 hr tablet Take 10 mg by mouth in the morning. 07/07/23 07/06/24 Yes [provider]  pantoprazole (PROTONIX) 40 MG tablet Take 40 mg by mouth 2 (two) times daily.   Yes [provider]  propranolol ER (INDERAL LA) 80 MG 24 hr capsule Take 80 mg by mouth in the morning. 07/16/22  Yes [provider]  sertraline (ZOLOFT) 25 MG tablet Take 25-50 mg by mouth See admin instructions. Take 1 tablet (25 mg) by mouth in the morning & take 2 tablets (50 mg) by mouth at night 02/28/22  Yes [provider]     All other systems have been reviewed and were otherwise negative with the exception of those mentioned in the HPI and as above.  Physical Exam: Vitals:   10/01/23 1150  BP: 139/86  Pulse: (!) 54  Resp: 18  SpO2: 100%    Body mass index is 23.11 kg/m.  General: Alert, no acute distress Cardiovascular: No pedal edema Respiratory: No cyanosis, no use of accessory  musculature Skin: No lesions in the area of chief complaint Neurologic: Sensation intact distally Psychiatric: Patient is competent for consent with normal mood and affect Lymphatic: No axillary or cervical lymphadenopathy   Assessment/Plan: LEFT SIDED CERVICAL RADICULOPATHY Plan for Procedure(s): ANTERIOR CERVICAL DECOMPRESSION FUSION CERVICAL 5- CERVICAL 6, CERVICAL 6- CERVICAL 7 WITH INSTRUMENTATION AND ALLOGRAFT   Jackelyn Hoehn, MD 10/01/2023 2:07 PM

## 2023-10-01 NOTE — Progress Notes (Signed)
Kayla PA for Dr. Yevette Edwards having problems with the computer unable to update the AVS but patient is ok to discharge per her. Discussed the discharge with [facility] caregiver and the medications that [facility] prescribed.

## 2023-10-01 NOTE — Anesthesia Preprocedure Evaluation (Signed)
Anesthesia Evaluation  Patient identified by MRN, date of birth, ID band Patient awake    Reviewed: Allergy & Precautions, H&P , NPO status , Patient's Chart, lab work & pertinent test results  Airway Mallampati: II  TM Distance: >3 FB Neck ROM: Full    Dental no notable dental hx.    Pulmonary sleep apnea    Pulmonary exam normal breath sounds clear to auscultation       Cardiovascular hypertension, negative cardio ROS Normal cardiovascular exam Rhythm:Regular Rate:Normal     Neuro/Psych  Headaches PSYCHIATRIC DISORDERS Anxiety Depression       GI/Hepatic Neg liver ROS,GERD  ,,  Endo/Other  negative endocrine ROS    Renal/GU negative Renal ROS  negative genitourinary   Musculoskeletal negative musculoskeletal ROS (+)    Abdominal   Peds negative pediatric ROS (+)  Hematology negative hematology ROS (+)   Anesthesia Other Findings   Reproductive/Obstetrics negative OB ROS                              Anesthesia Physical Anesthesia Plan  ASA: 3  Anesthesia Plan: General   Post-op Pain Management:    Induction: Intravenous  PONV Risk Score and Plan: Ondansetron and Dexamethasone  Airway Management Planned: Oral ETT  Additional Equipment:   Intra-op Plan:   Post-operative Plan: Extubation in OR  Informed Consent: I have reviewed the patients History and Physical, chart, labs and discussed the procedure including the risks, benefits and alternatives for the proposed anesthesia with the patient or authorized representative who has indicated his/her understanding and acceptance.     Dental advisory given  Plan Discussed with: CRNA  Anesthesia Plan Comments:          Anesthesia Quick Evaluation

## 2023-10-01 NOTE — Op Note (Signed)
PATIENT NAME: Jim Smith   MEDICAL RECORD NO.:   [id number]    DATE OF BIRTH: [birth year]   DATE OF PROCEDURE: 10/01/2023                               OPERATIVE REPORT     PREOPERATIVE DIAGNOSES: 1. Left-sided cervical radiculopathy 2. Spinal stenosis spanning C5-C7   POSTOPERATIVE DIAGNOSES: 1. Left-sided cervical radiculopathy 2. Spinal stenosis spanning C5-C7   PROCEDURE: 1. Anterior cervical decompression and fusion C5-6, C6-7 2. Placement of anterior instrumentation, C5-C7 3. Insertion of interbody device x2 (Titan intervertebral spacers). 4. Intraoperative use of fluoroscopy. 5. Use of morselized allograft - ViviGen.   SURGEON:  Estill Bamberg, MD   ASSISTANT:  Jason Coop, PA-C.   ANESTHESIA:  General endotracheal anesthesia.   COMPLICATIONS:  None.   DISPOSITION:  Stable.   ESTIMATED BLOOD LOSS:  Minimal.   INDICATIONS FOR SURGERY:  Briefly, Jim Smith is a pleasant 57 y.o. -year- old male, who did present to me with severe pain in his neck and left arm.  The patient's MRI did reveal the findings noted above.  Given the patient's ongoing rather debilitating pain and lack of improvement with appropriate treatment measures, we did discuss proceeding with the procedure noted above.  The patient was fully aware of the risks and limitations of surgery as outlined in my preoperative note.   OPERATIVE DETAILS:  On 10/01/2023 the patient was brought to surgery and general endotracheal anesthesia was administered.  The patient was placed supine on the hospital bed. The neck was gently extended.  All bony prominences were meticulously padded.  The neck was prepped and draped in the usual sterile fashion.  At this point, I did make a left-sided transverse incision.  The platysma was incised.  A Smith-Robinson approach was used and the anterior spine was identified. A self-retaining retractor was placed.  I then subperiosteally exposed the vertebral bodies from C5-C7.   Caspar pins were then placed into the C6 and C7 vertebral bodies and distraction was applied.  A thorough and complete C6-7 intervertebral diskectomy was performed.  The posterior longitudinal ligament was identified and entered using a nerve hook.  I then used #1 followed by #2 Kerrison to perform a thorough and complete intervertebral diskectomy.  The spinal canal was thoroughly decompressed, as was the left neuroforamen.  The endplates were then prepared and the appropriate-sized intervertebral spacer was then packed with ViviGen and tamped into position in the usual fashion.  The lower Caspar pin was then removed and placed into the C5 vertebral body and once again, distraction was applied across the C5-6 intervertebral space.  I then again performed a thorough and complete diskectomy, thoroughly decompressing the spinal canal and left neuroforamen.  After preparing the endplates, the appropriate-sized intervertebral spacer was packed with ViviGen and tamped into position.  The Caspar pins then were removed and bone wax was placed in their place.  The appropriate-sized anterior cervical plate was placed over the anterior spine.  16 mm variable angle screws were placed, 2 in each vertebral body from C5-C7 for a total of 6 vertebral body screws.  The screws were then locked to the plate using the Cam locking mechanism.  I was very pleased with the final fluoroscopic images.  The wound was then irrigated.  The wound was then explored for any undue bleeding and there was no bleeding noted. The wound was then closed in layers  using 2-0 Vicryl, followed by 4-0 Monocryl.  Benzoin and Steri-Strips were applied, followed by sterile dressing.  All instrument counts were correct at the termination of the procedure.   Of note, Jason Coop, PA-C, was my assistant throughout surgery, and did aid in retraction, suctioning, and closure from start to finish.       Estill Bamberg, MD

## 2023-10-01 NOTE — Anesthesia Procedure Notes (Signed)
Procedure Name: Intubation Date/Time: 10/01/2023 3:36 PM  Performed by: Georgianne Fick D, CRNAPre-anesthesia Checklist: Patient identified, Emergency Drugs available, Suction available and Patient being monitored Patient Re-evaluated:Patient Re-evaluated prior to induction Oxygen Delivery Method: Circle System Utilized Preoxygenation: Pre-oxygenation with 100% oxygen Induction Type: IV induction Ventilation: Mask ventilation without difficulty Laryngoscope Size: Glidescope and 4 Grade View: Grade I Tube type: Oral Tube size: 7.5 mm Number of attempts: 1 Airway Equipment and Method: Stylet and Oral airway Placement Confirmation: ETT inserted through vocal cords under direct vision, positive ETCO2 and breath sounds checked- equal and bilateral Secured at: 23 cm Tube secured with: Tape Dental Injury: Teeth and Oropharynx as per pre-operative assessment

## 2023-10-02 NOTE — Anesthesia Postprocedure Evaluation (Signed)
Anesthesia Post Note  Patient: Arleigh Odowd  Procedure(s) Performed: ANTERIOR CERVICAL DECOMPRESSION FUSION CERVICAL 5- CERVICAL 6, CERVICAL 6- CERVICAL 7 WITH INSTRUMENTATION AND ALLOGRAFT     Patient location during evaluation: PACU Anesthesia Type: General Level of consciousness: awake and alert Pain management: pain level controlled Vital Signs Assessment: post-procedure vital signs reviewed and stable Respiratory status: spontaneous breathing, nonlabored ventilation, respiratory function stable and patient connected to nasal cannula oxygen Cardiovascular status: blood pressure returned to baseline and stable Postop Assessment: no apparent nausea or vomiting Anesthetic complications: no   No notable events documented.  Last Vitals:  Vitals:   10/01/23 1815 10/01/23 1830  BP: (!) 157/96 (!) 159/95  Pulse: (!) 57 61  Resp: 10 13  Temp:  36.6 C  SpO2: 95% 96%    Last Pain:  Vitals:   10/01/23 1815  TempSrc:   PainSc: Asleep                 Mariann Barter

## 2023-10-06 ENCOUNTER — Encounter (HOSPITAL_COMMUNITY): Payer: Self-pay | Admitting: Orthopedic Surgery

## 2023-10-14 ENCOUNTER — Emergency Department (HOSPITAL_BASED_OUTPATIENT_CLINIC_OR_DEPARTMENT_OTHER)
Admission: EM | Admit: 2023-10-14 | Discharge: 2023-10-14 | Disposition: A | Discharge status: Home / Self Care | Payer: Commercial Managed Care - PPO | Attending: Emergency Medicine | Admitting: Emergency Medicine

## 2023-10-14 ENCOUNTER — Other Ambulatory Visit: Payer: Self-pay

## 2023-10-14 ENCOUNTER — Encounter (HOSPITAL_BASED_OUTPATIENT_CLINIC_OR_DEPARTMENT_OTHER): Payer: Self-pay | Admitting: Emergency Medicine

## 2023-10-14 ENCOUNTER — Emergency Department (HOSPITAL_BASED_OUTPATIENT_CLINIC_OR_DEPARTMENT_OTHER): Payer: Commercial Managed Care - PPO

## 2023-10-14 DIAGNOSIS — R0602 Shortness of breath: Secondary | ICD-10-CM | POA: Diagnosis present

## 2023-10-14 DIAGNOSIS — I10 Essential (primary) hypertension: Secondary | ICD-10-CM | POA: Diagnosis not present

## 2023-10-14 DIAGNOSIS — R0600 Dyspnea, unspecified: Secondary | ICD-10-CM

## 2023-10-14 LAB — CBC WITH DIFFERENTIAL/PLATELET
Abs Immature Granulocytes: 0.09 10*3/uL — ABNORMAL HIGH (ref 0.00–0.07)
Basophils Absolute: 0.1 10*3/uL (ref 0.0–0.1)
Basophils Relative: 1 %
Eosinophils Absolute: 0.1 10*3/uL (ref 0.0–0.5)
Eosinophils Relative: 1 %
HCT: 36 % — ABNORMAL LOW (ref 39.0–52.0)
Hemoglobin: 12.2 g/dL — ABNORMAL LOW (ref 13.0–17.0)
Immature Granulocytes: 1 %
Lymphocytes Relative: 22 %
Lymphs Abs: 2.2 10*3/uL (ref 0.7–4.0)
MCH: 31.9 pg (ref 26.0–34.0)
MCHC: 33.9 g/dL (ref 30.0–36.0)
MCV: 94.2 fL (ref 80.0–100.0)
Monocytes Absolute: 1.1 10*3/uL — ABNORMAL HIGH (ref 0.1–1.0)
Monocytes Relative: 11 %
Neutro Abs: 6.4 10*3/uL (ref 1.7–7.7)
Neutrophils Relative %: 64 %
Platelets: 292 10*3/uL (ref 150–400)
RBC: 3.82 MIL/uL — ABNORMAL LOW (ref 4.22–5.81)
RDW: 12.5 % (ref 11.5–15.5)
WBC: 9.9 10*3/uL (ref 4.0–10.5)
nRBC: 0 % (ref 0.0–0.2)

## 2023-10-14 LAB — BASIC METABOLIC PANEL
Anion gap: 8 (ref 5–15)
BUN: 22 mg/dL — ABNORMAL HIGH (ref 6–20)
CO2: 26 mmol/L (ref 22–32)
Calcium: 8.5 mg/dL — ABNORMAL LOW (ref 8.9–10.3)
Chloride: 97 mmol/L — ABNORMAL LOW (ref 98–111)
Creatinine, Ser: 0.78 mg/dL (ref 0.61–1.24)
GFR, Estimated: >60 mL/min (ref >60)
Glucose, Bld: 142 mg/dL — ABNORMAL HIGH (ref 70–99)
Potassium: 3.6 mmol/L (ref 3.5–5.1)
Sodium: 131 mmol/L — ABNORMAL LOW (ref 135–145)

## 2023-10-14 LAB — TROPONIN I (HIGH SENSITIVITY)
Troponin I (High Sensitivity): 4 ng/L (ref <18)
Troponin I (High Sensitivity): 4 ng/L (ref <18)

## 2023-10-14 MED ORDER — IOHEXOL 350 MG/ML SOLN
75.0000 mL | Freq: Once | INTRAVENOUS | Status: AC | PRN
  Administered 2023-10-14: 75 mL via INTRAVENOUS

## 2023-10-14 NOTE — ED Notes (Signed)
ED Provider at bedside. 

## 2023-10-14 NOTE — ED Provider Notes (Signed)
[place] [facility] AT [facility] Provider Note   [name]: [id number] Arrival date & time: 10/14/23  0119     History  Chief Complaint  Patient presents with   Shortness of Breath    Jim Smith is a 57 y.o. male.  Patient with a history of sleep apnea, hypertension, depression, anxiety presenting with difficulty breathing that woke him from sleep.  He is wearing a cervical spine collar after having ACDF of his cervical spine October 2 by Dr. Yevette Edwards.  States he fell asleep while sitting in a chair and woke up gasping for breath and feeling "disoriented".  He feels he is breathing better now after about 1 hour.  States he believes he was panicking.  He has had issues with difficulty swallowing and constipation since the surgery which are not changed today.  He is now moving his bowels after taking some laxatives at home.  Denies any abdominal pain, fever, nausea, vomiting, chest pain.  Still feeling shortness of breath and still having difficulty swallowing.  Denies fever.  No new focal weakness, numbness or tingling.  No bowel or bladder incontinence.  No fever. Denies any history of COPD or asthma.  Does have sleep apnea with inspire device and does not use CPAP.  The history is provided by the patient.  Shortness of Breath Associated symptoms: no abdominal pain, no chest pain, no fever, no headaches, no rash and no vomiting        Home Medications Prior to Admission medications   Medication Sig Start Date End Date Taking? Authorizing Provider  acetaminophen (TYLENOL) 650 MG CR tablet Take 1,300 mg by mouth every 8 (eight) hours as needed for pain.    [provider]  Ascorbic Acid (VITAMIN C PO) Take 1 tablet by mouth 2 (two) times a week. One to two times weekly    [provider]  atorvastatin (LIPITOR) 10 MG tablet Take 10 mg by mouth at bedtime. 01/22/23   [provider]  buPROPion (WELLBUTRIN SR) 200 MG 12 hr tablet Take 200  mg by mouth 2 (two) times daily. 12/15/15   [provider]  Cholecalciferol (VITAMIN D-3 PO) Take 1 tablet by mouth 2 (two) times a week. One to two times weekly    [provider]  clindamycin (CLEOCIN T) 1 % lotion Apply 1 Application topically daily as needed (skin blemishes/acne).    [provider]  Cyanocobalamin (VITAMIN B-12) 5000 MCG SUBL Take 5,000 mcg by mouth daily.    [provider]  cyclobenzaprine (FLEXERIL) 10 MG tablet Take 10 mg by mouth at bedtime.    [provider]  famotidine-calcium carbonate-magnesium hydroxide (PEPCID COMPLETE) 10-800-165 MG chewable tablet Chew 1 tablet by mouth daily as needed (acid reflux/indigestion.).    [provider]  gabapentin (NEURONTIN) 300 MG capsule Take 300 mg by mouth at bedtime.    [provider]  montelukast (SINGULAIR) 10 MG tablet Take 10 mg by mouth at bedtime.    [provider]  ondansetron (ZOFRAN ODT) 4 MG disintegrating tablet Take 1 tablet (4 mg total) by mouth every 8 (eight) hours as needed for nausea or vomiting. 09/24/20   Avegno, Zachery Dakins, FNP  oxybutynin (DITROPAN-XL) 10 MG 24 hr tablet Take 10 mg by mouth in the morning. 07/07/23 07/06/24  [provider]  pantoprazole (PROTONIX) 40 MG tablet Take 40 mg by mouth 2 (two) times daily.    [provider]  propranolol ER (INDERAL LA) 80  MG 24 hr capsule Take 80 mg by mouth in the morning. 07/16/22   [provider]  sertraline (ZOLOFT) 25 MG tablet Take 25-50 mg by mouth See admin instructions. Take 1 tablet (25 mg) by mouth in the morning & take 2 tablets (50 mg) by mouth at night 02/28/22   [provider]      Allergies    Bactrim [sulfamethoxazole-trimethoprim], Buprenorphine hcl, Voltaren [diclofenac], Dm-doxylamine-acetaminophen, Morphine and codeine, Penicillins, and Tamiflu [oseltamivir]    Review of Systems   Review of Systems  Constitutional:  Negative for  activity change, appetite change and fever.  HENT:  Negative for congestion and rhinorrhea.   Respiratory:  Positive for shortness of breath. Negative for chest tightness.   Cardiovascular:  Negative for chest pain.  Gastrointestinal:  Negative for abdominal pain, nausea and vomiting.  Genitourinary:  Negative for dysuria and hematuria.  Musculoskeletal:  Negative for arthralgias and myalgias.  Skin:  Negative for rash.  Neurological:  Negative for dizziness, weakness and headaches.   all other systems are negative except as noted in the HPI and PMH.    Physical Exam Updated Vital Signs BP 122/77 (BP Location: Right Arm)   Pulse 61   Temp 98 F (36.7 C) (Oral)   Resp 20   SpO2 99%  Physical Exam Vitals and nursing note reviewed.  Constitutional:      General: He is not in acute distress.    Appearance: He is well-developed.  HENT:     Head: Normocephalic and atraumatic.     Mouth/Throat:     Pharynx: No oropharyngeal exudate.     Comments: Oropharynx is clear, no tongue or lip swelling, controlling secretions, no stridor, speaking full sentences, no distress Eyes:     Conjunctiva/sclera: Conjunctivae normal.     Pupils: Pupils are equal, round, and reactive to light.  Neck:     Comments: C-collar in place Cardiovascular:     Rate and Rhythm: Normal rate and regular rhythm.     Heart sounds: Normal heart sounds. No murmur heard. Pulmonary:     Effort: Pulmonary effort is normal. No respiratory distress.     Breath sounds: Normal breath sounds.  Abdominal:     Palpations: Abdomen is soft.     Tenderness: There is no abdominal tenderness. There is no guarding or rebound.  Musculoskeletal:        General: No tenderness. Normal range of motion.     Cervical back: Normal range of motion and neck supple.  Skin:    General: Skin is warm.  Neurological:     Mental Status: He is alert and oriented to person, place, and time.     Cranial Nerves: No cranial nerve deficit.      Motor: No abnormal muscle tone.     Coordination: Coordination normal.     Comments:  5/5 strength throughout. CN 2-12 intact.Equal grip strength.   Psychiatric:        Behavior: Behavior normal.     ED Results / Procedures / Treatments   Labs (all labs ordered are listed, but only abnormal results are displayed) Labs Reviewed  CBC WITH DIFFERENTIAL/PLATELET - Abnormal; Notable for the following components:      Result Value   RBC 3.82 (*)    Hemoglobin 12.2 (*)    HCT 36.0 (*)    Monocytes Absolute 1.1 (*)    Abs Immature Granulocytes 0.09 (*)    All other components within normal limits  BASIC METABOLIC PANEL -  Abnormal; Notable for the following components:   Sodium 131 (*)    Chloride 97 (*)    Glucose, Bld 142 (*)    BUN 22 (*)    Calcium 8.5 (*)    All other components within normal limits  TROPONIN I (HIGH SENSITIVITY)  TROPONIN I (HIGH SENSITIVITY)    EKG EKG Interpretation Date/Time:  Tuesday October 14 2023 02:02:19 EDT Ventricular Rate:  55 PR Interval:  216 QRS Duration:  103 QT Interval:  457 QTC Calculation: 438 R Axis:   82  Text Interpretation: Sinus rhythm Prolonged PR interval Nonspecific T abnrm, anterolateral leads ST elevation, consider inferior injury No significant change was found Confirmed by Glynn Octave [phone]) on 10/14/2023 2:05:31 AM  Radiology CT Angio Chest PE W and/or Wo Contrast  Result Date: 10/14/2023 CLINICAL DATA:  Six days status post C5-7 ACDF surgery. Awoke with difficulty breathing. High probability of pulmonary embolism. EXAM: CT ANGIOGRAPHY CHEST WITH CONTRAST TECHNIQUE: Multidetector CT imaging of the chest was performed using the standard protocol during bolus administration of intravenous contrast. Multiplanar CT image reconstructions and MIPs were obtained to evaluate the vascular anatomy. RADIATION DOSE REDUCTION: This exam was performed according to the departmental dose-optimization program which includes automated  exposure control, adjustment of the mA and/or kV according to patient size and/or use of iterative reconstruction technique. CONTRAST:  75mL OMNIPAQUE IOHEXOL 350 MG/ML SOLN COMPARISON:  Portable chest today, chest CT with IV contrast 08/06/2023. FINDINGS: Cardiovascular: Satisfactory opacification of the pulmonary arteries to the segmental level. No evidence of pulmonary embolism. Normal heart size. No pericardial effusion. There are scattered calcific plaques in the LAD coronary artery. No other visible coronary calcifications. The aorta and great vessels are unremarkable. The pulmonary veins are nondistended. Mediastinum/Nodes: No enlarged mediastinal, hilar, or axillary lymph nodes. Thyroid gland, trachea, and esophagus demonstrate no significant findings. Lungs/Pleura: There is a 6 mm pleural-based right lower lobe nodule laterally on 303:108, unchanged since 10/12/2019 coronary CTA and considered benign at this point. There is mild chronic elevation of the right hemidiaphragm. The lungs are clear of infiltrates and clear of further nodules. There is no pleural effusion, thickening or pneumothorax. There is mild posterior atelectasis. Upper Abdomen: No acute abnormality. Musculoskeletal: C5-7 ACDF plating with interbody metal hardware. No acute or other significant osseous findings. Right chest implanted battery with single wire extending up into the right neck is again noted. According to Epic notes this is a hypoglossal nerve stimulator. No chest wall mass is seen. Review of the MIP images confirms the above findings. IMPRESSION: 1. No evidence of pulmonary arterial embolus or other acute chest CT findings. 2. Scattered calcific plaques in the LAD coronary artery. 3. 6 mm pleural-based right lower lobe nodule, unchanged since 10/12/2019. Assumed benign at this point. Electronically Signed   By: Almira Bar M.D.   On: 10/14/2023 05:13   DG Chest 2 View  Result Date: 10/14/2023 CLINICAL DATA:  Six days  postoperative from anterior cervical fusion procedure, awoke with difficulty breathing. EXAM: CHEST - 2 VIEW COMPARISON:  Chest CT with contrast 08/06/2023. FINDINGS: The heart size and mediastinal contours are within normal limits. Both lungs are clear. There is at least a 3 level ACDF plating partially visible in the lower cervical spine new from the prior study. Again noted is a right chest implanted battery with single wire extending up into the right neck. Multiple overlying monitor wires. IMPRESSION: No active cardiopulmonary disease. Electronically Signed   By: Earlean Shawl.D.  On: 10/14/2023 04:47   CT Cervical Spine Wo Contrast  Result Date: 10/14/2023 CLINICAL DATA:  Six days after ACDF.  Trouble swallowing. EXAM: CT CERVICAL SPINE WITHOUT CONTRAST TECHNIQUE: Multidetector CT imaging of the cervical spine was performed without intravenous contrast. Multiplanar CT image reconstructions were also generated. RADIATION DOSE REDUCTION: This exam was performed according to the departmental dose-optimization program which includes automated exposure control, adjustment of the mA and/or kV according to patient size and/or use of iterative reconstruction technique. COMPARISON:  CT myelogram 09/04/2023 FINDINGS: Alignment: Physiologic Skull base and vertebrae: No acute fracture. No primary bone lesion or focal pathologic process. Soft tissues and spinal canal: Prevertebral fat edema, expected after recent ACDF. No collection or mass effect on the adjacent structures. Hypoglossal stimulator noted on the right. Disc levels: C5-6 and C6-7 ACDF. Hardware is located. No bony impingement throughout the cervical spine. Upper chest: Negative IMPRESSION: Recent C5-6 and C6-7 ACDF.  No unexpected finding Electronically Signed   By: Tiburcio Pea M.D.   On: 10/14/2023 04:00    Procedures Procedures    Medications Ordered in ED Medications - No data to display  ED Course/ Medical Decision Making/ A&P                                  Medical Decision Making Amount and/or Complexity of Data Reviewed Labs: ordered. Decision-making details documented in ED Course. Radiology: ordered and independent interpretation performed. Decision-making details documented in ED Course. ECG/medicine tests: ordered and independent interpretation performed. Decision-making details documented in ED Course.  Risk Prescription drug management.   Patient awoke with shortness of breath and difficulty swallowing.  Recent cervical spine fusion.  No hypoxia or increased work of breathing, clear lungs.  EKG shows no acute ischemia. He has no increased work of breathing or hypoxia.  Clear lungs.  Chest x-ray is obtained.  No evidence of pneumothorax or pneumonia.  Results reviewed and interpreted by me.  Question whether his dyspnea is related to sleep apnea or anxiety.  Consider his recent cervical spine fusion. Will consider pulmonary embolism as well as postoperative complication.  CT imaging will be obtained.  No evidence of pulmonary embolism or pneumonia.  No evidence of unexpected postsurgical complication in cervical spine.  No evidence of significant hematoma or abscess.  Troponin negative x 2.  Low suspicion for ACS.  Patient feels improved and back to baseline.  He is sleeping comfortably.  States his chest pain has resolved.  He has follow-up with his orthopedic surgeon in 2 days.  No evidence of ACS or PE.  Return to the ED with difficulty breathing, difficulty swallowing, chest pain or other concerns.        Final Clinical Impression(s) / ED Diagnoses Final diagnoses:  None    Rx / DC Orders ED Discharge Orders     None         Bennet Kujawa, Jeannett Senior, MD 10/14/23 [phone]

## 2023-10-14 NOTE — Discharge Instructions (Addendum)
Your testing is reassuring.  No evidence of heart attack or blood clot in the lung.  No evidence of pneumonia.  No evidence of unexpected postsurgical complication.  Your shortness of breath may be related to your sleep apnea or anxiety. Follow up with your surgeon as scheduled.  Return to the ED with exertional chest pain, pain associate shortness of breath, nausea, vomiting, sweating or other concerns.

## 2023-10-14 NOTE — ED Triage Notes (Addendum)
States woke up and it was hard to get his breath. 6 days post op [facility] , states is breathing better but his daughter wanted him to be checked out. No distress noted

## 2024-03-02 NOTE — Progress Notes (Signed)
 Anesthesia Review:  PCP: Eksir- LOV 01/28/24  Cardiologist :  PPM/ ICD: Device Orders: Rep Notified:  Chest x-ray : 10/14/23 2 view  CT angio chest 10/14/23  EKG : 10/14/23  CT Cors- 2020  Echo : Stress test: Cardiac Cath :   Activity level:  Sleep Study/ CPAP : Fasting Blood Sugar :      / Checks Blood Sugar -- times a day:    Blood Thinner/ Instructions /Last Dose: ASA / Instructions/ Last Dose :

## 2024-03-02 NOTE — Patient Instructions (Signed)
 SURGICAL WAITING ROOM VISITATION  Patients having surgery or a procedure may have no more than 2 support people in the waiting area - these visitors may rotate.    Children under the age of 45 must have an adult with them who is not the patient.  Due to an increase in RSV and influenza rates and associated hospitalizations, children ages 67 and under may not visit patients in [facility] hospitals.  Visitors with respiratory illnesses are discouraged from visiting and should remain at home.  If the patient needs to stay at the hospital during part of their recovery, the visitor guidelines for inpatient rooms apply. Pre-op nurse will coordinate an appropriate time for 1 support person to accompany patient in pre-op.  This support person may not rotate.    Please refer to the [facility] website for the visitor guidelines for Inpatients (after your surgery is over and you are in a regular room).       Your procedure is scheduled on:  03/09/2024    Report to [facility] Main Entrance    Report to admitting at  1130 AM   Call this number if you have problems the morning of surgery [phone]   Do not eat food :After Midnight.   After Midnight you may have the following liquids until _ 1030_____ AM  DAY OF SURGERY  Water Non-Citrus Juices (without pulp, NO RED-Apple, White grape, White cranberry) Black Coffee (NO MILK/CREAM OR CREAMERS, sugar ok)  Clear Tea (NO MILK/CREAM OR CREAMERS, sugar ok) regular and decaf                             Plain Jell-O (NO RED)                                           Fruit ices (not with fruit pulp, NO RED)                                     Popsicles (NO RED)                                                               Sports drinks like Gatorade (NO RED)                     The day of surgery:  Drink ONE (1) Pre-Surgery Clear Ensure or G2 at  1030AM  ( have completed by ) the morning of surgery. Drink in one sitting. Do not sip.   This drink was given to you during your hospital  pre-op appointment visit. Nothing else to drink after completing the  Pre-Surgery Clear Ensure or G2.          If you have questions, please contact your surgeon's office.       Oral Hygiene is also important to reduce your risk of infection.  Remember - BRUSH YOUR TEETH THE MORNING OF SURGERY WITH YOUR REGULAR TOOTHPASTE  DENTURES WILL BE REMOVED PRIOR TO SURGERY PLEASE DO NOT APPLY "Poly grip" OR ADHESIVES!!!   Do NOT smoke after Midnight   Stop all vitamins and herbal supplements 7 days before surgery.   Take these medicines the morning of surgery with A SIP OF WATER:   DO NOT TAKE ANY ORAL DIABETIC MEDICATIONS DAY OF YOUR SURGERY  Bring CPAP mask and tubing day of surgery.                              You may not have any metal on your body including hair pins, jewelry, and body piercing             Do not wear make-up, lotions, powders, perfumes/cologne, or deodorant  Do not wear nail polish including gel and S&S, artificial/acrylic nails, or any other type of covering on natural nails including finger and toenails. If you have artificial nails, gel coating, etc. that needs to be removed by a nail salon please have this removed prior to surgery or surgery may need to be canceled/ delayed if the surgeon/ anesthesia feels like they are unable to be safely monitored.   Do not shave  48 hours prior to surgery.               Men may shave face and neck.   Do not bring valuables to the hospital. [facility] IS NOT             RESPONSIBLE   FOR VALUABLES.   Contacts, glasses, dentures or bridgework may not be worn into surgery.   Bring small overnight bag day of surgery.   DO NOT BRING YOUR HOME MEDICATIONS TO THE HOSPITAL. PHARMACY WILL DISPENSE MEDICATIONS LISTED ON YOUR MEDICATION LIST TO YOU DURING YOUR ADMISSION IN THE HOSPITAL!    Patients discharged on the day of surgery will not be  allowed to drive home.  Someone NEEDS to stay with you for the first 24 hours after anesthesia.   Special Instructions: Bring a copy of your healthcare power of attorney and living will documents the day of surgery if you haven't scanned them before.              Please read over the following fact sheets you were given: IF YOU HAVE QUESTIONS ABOUT YOUR PRE-OP INSTRUCTIONS PLEASE CALL [phone]   If you received a COVID test during your pre-op visit  it is requested that you wear a mask when out in public, stay away from anyone that may not be feeling well and notify your surgeon if you develop symptoms. If you test positive for Covid or have been in contact with anyone that has tested positive in the last 10 days please notify you surgeon.    [facility] - Preparing for Surgery Before surgery, you can play an important role.  Because skin is not sterile, your skin needs to be as free of germs as possible.  You can reduce the number of germs on your skin by washing with CHG (chlorahexidine gluconate) soap before surgery.  CHG is an antiseptic cleaner which kills germs and bonds with the skin to continue killing germs even after washing. Please DO NOT use if you have an allergy to CHG or antibacterial soaps.  If your skin becomes reddened/irritated stop using the CHG and inform your nurse when you arrive at  Short Stay. Do not shave (including legs and underarms) for at least 48 hours prior to the first CHG shower.  You may shave your face/neck. Please follow these instructions carefully:  1.  Shower with CHG Soap the night before surgery and the  morning of Surgery.  2.  If you choose to wash your hair, wash your hair first as usual with your  normal  shampoo.  3.  After you shampoo, rinse your hair and body thoroughly to remove the  shampoo.                           4.  Use CHG as you would any other liquid soap.  You can apply chg directly  to the skin and wash                       Gently with  a scrungie or clean washcloth.  5.  Apply the CHG Soap to your body ONLY FROM THE NECK DOWN.   Do not use on face/ open                           Wound or open sores. Avoid contact with eyes, ears mouth and genitals (private parts).                       Wash face,  Genitals (private parts) with your normal soap.             6.  Wash thoroughly, paying special attention to the area where your surgery  will be performed.  7.  Thoroughly rinse your body with warm water from the neck down.  8.  DO NOT shower/wash with your normal soap after using and rinsing off  the CHG Soap.                9.  Pat yourself dry with a clean towel.            10.  Wear clean pajamas.            11.  Place clean sheets on your bed the night of your first shower and do not  sleep with pets. Day of Surgery : Do not apply any lotions/deodorants the morning of surgery.  Please wear clean clothes to the hospital/surgery center.  FAILURE TO FOLLOW THESE INSTRUCTIONS MAY RESULT IN THE CANCELLATION OF YOUR SURGERY PATIENT SIGNATURE_________________________________  NURSE SIGNATURE__________________________________  ________________________________________________________________________

## 2024-03-03 ENCOUNTER — Encounter (HOSPITAL_COMMUNITY): Payer: Self-pay

## 2024-03-03 ENCOUNTER — Encounter (HOSPITAL_COMMUNITY)
Admission: RE | Admit: 2024-03-03 | Discharge: 2024-03-03 | Disposition: A | Discharge status: Home / Self Care | Source: Ambulatory Visit | Attending: Surgery | Admitting: Surgery

## 2024-03-03 ENCOUNTER — Other Ambulatory Visit: Payer: Self-pay

## 2024-03-03 VITALS — BP 141/91 | HR 60 | Temp 98.5°F | Resp 16 | Ht 74.0 in | Wt 174.0 lb

## 2024-03-03 DIAGNOSIS — K4020 Bilateral inguinal hernia, without obstruction or gangrene, not specified as recurrent: Secondary | ICD-10-CM | POA: Insufficient documentation

## 2024-03-03 DIAGNOSIS — Z981 Arthrodesis status: Secondary | ICD-10-CM | POA: Diagnosis not present

## 2024-03-03 DIAGNOSIS — I2510 Atherosclerotic heart disease of native coronary artery without angina pectoris: Secondary | ICD-10-CM | POA: Insufficient documentation

## 2024-03-03 DIAGNOSIS — I10 Essential (primary) hypertension: Secondary | ICD-10-CM | POA: Diagnosis not present

## 2024-03-03 DIAGNOSIS — E871 Hypo-osmolality and hyponatremia: Secondary | ICD-10-CM | POA: Insufficient documentation

## 2024-03-03 DIAGNOSIS — Z01818 Encounter for other preprocedural examination: Secondary | ICD-10-CM

## 2024-03-03 DIAGNOSIS — G4733 Obstructive sleep apnea (adult) (pediatric): Secondary | ICD-10-CM | POA: Diagnosis not present

## 2024-03-03 LAB — CBC
HCT: 42.8 % (ref 39.0–52.0)
Hemoglobin: 14.5 g/dL (ref 13.0–17.0)
MCH: 31.1 pg (ref 26.0–34.0)
MCHC: 33.9 g/dL (ref 30.0–36.0)
MCV: 91.8 fL (ref 80.0–100.0)
Platelets: 255 10*3/uL (ref 150–400)
RBC: 4.66 MIL/uL (ref 4.22–5.81)
RDW: 13.4 % (ref 11.5–15.5)
WBC: 9.3 10*3/uL (ref 4.0–10.5)
nRBC: 0 % (ref 0.0–0.2)

## 2024-03-03 LAB — BASIC METABOLIC PANEL
Anion gap: 11 (ref 5–15)
BUN: 16 mg/dL (ref 6–20)
CO2: 23 mmol/L (ref 22–32)
Calcium: 9 mg/dL (ref 8.9–10.3)
Chloride: 95 mmol/L — ABNORMAL LOW (ref 98–111)
Creatinine, Ser: 0.96 mg/dL (ref 0.61–1.24)
GFR, Estimated: >60 mL/min (ref >60)
Glucose, Bld: 97 mg/dL (ref 70–99)
Potassium: 4 mmol/L (ref 3.5–5.1)
Sodium: 129 mmol/L — ABNORMAL LOW (ref 135–145)

## 2024-03-08 ENCOUNTER — Ambulatory Visit: Payer: Self-pay | Admitting: Surgery

## 2024-03-08 ENCOUNTER — Encounter (HOSPITAL_COMMUNITY): Payer: Self-pay

## 2024-03-08 NOTE — Progress Notes (Signed)
 Case: [id number] Date/Time: 03/09/24 1315   Procedure: LAPAROSCOPIC BILATERAL INGUINAL HERNIA WITH MESH (Bilateral)   Anesthesia type: General   Pre-op diagnosis: BILATERAL INGUINAL HERNIA   Location: [facility] ROOM 01 / [facility]   Surgeons: Stechschulte, Hyman Hopes, MD       DISCUSSION: Jim Smith is a 58 yo male who presents to PAT prior to surgery above. PMH of HTN, CAD (by CT), OSA s/p inspire (06/2021), migraine, GERD, anxiety, depression, hx of ACDF C5-C7 (09/2023).  Patient with ED visit on 10/13/23 for SOB. Had CTA of Chest which was negative and negative chest pain w/u.  Last seen by PCP on 01/28/24 for routine f/u. He reported improvement in his chronic neck pain after ACDF but has residual upper extremity weakness. Zoloft dose was increased. Of note patient has had chronic hyponatremia which is mildly worse on screening PAT labs. Results were forwarded to PCP and surgeon. Will recheck istat chem 8 for DOS.  VS: BP (!) 141/91   Pulse 60   Temp 36.9 C (Oral)   Resp 16   Ht 6\' 2"  (1.88 m)   Wt 78.9 kg   SpO2 98%   BMI 22.34 kg/m   PROVIDERS: Gwenlyn Found, MD   LABS: Labs reviewed: Repeat istat chem 8 (all labs ordered are listed, but only abnormal results are displayed)  Labs Reviewed  BASIC METABOLIC PANEL - Abnormal; Notable for the following components:      Result Value   Sodium 129 (*)    Chloride 95 (*)    All other components within normal limits  CBC     IMAGES:  CTA Chest 10/14/23  IMPRESSION: 1. No evidence of pulmonary arterial embolus or other acute chest CT findings. 2. Scattered calcific plaques in the LAD coronary artery. 3. 6 mm pleural-based right lower lobe nodule, unchanged since 10/12/2019. Assumed benign at this point.  EKG 03/03/24:  NSR, rate 65  CV:  CTA Coronaries 10/12/2019:   IMPRESSION: 1. Coronary calcium score of 69. This was 5 percentile for age and sex matched control.   2. Normal coronary origin with right  dominance.   3. CAD-RADS 2. Mild non-obstructive CAD (25-49%) in the proximal LAD. Consider non-atherosclerotic causes of chest pain. Consider preventive therapy and risk factor modification.   4.  LVEF 67%.    Past Medical History:  Diagnosis Date   Acute ear infection    Anxiety    Bronchitis    Depression    GERD (gastroesophageal reflux disease)    H/O removal of neck cyst 01/2012   R anterior neck I&D, with abx   Hyperlipidemia    Hypertension    Migraine    Sleep apnea    uses Bipap occaisionally    Past Surgical History:  Procedure Laterality Date   ANTERIOR CERVICAL DECOMP/DISCECTOMY FUSION N/A 10/01/2023   Procedure: ANTERIOR CERVICAL DECOMPRESSION FUSION CERVICAL 5- CERVICAL 6, CERVICAL 6- CERVICAL 7 WITH INSTRUMENTATION AND ALLOGRAFT;  Surgeon: Estill Bamberg, MD;  Location: [facility];  Service: Orthopedics;  Laterality: N/A;   DRUG INDUCED ENDOSCOPY N/A 11/01/2020   Procedure: DRUG INDUCED ENDOSCOPY;  Surgeon: Christia Reading, MD;  Location: [facility];  Service: ENT;  Laterality: N/A;   FRACTURE SURGERY Left 2009   tibial fx   IMPLANTATION OF HYPOGLOSSAL NERVE STIMULATOR Right 07/04/2021   Procedure: IMPLANTATION OF HYPOGLOSSAL NERVE STIMULATOR;  Surgeon: Christia Reading, MD;  Location: [facility];  Service: ENT;  Laterality: Right;   PILONIDAL CYST EXCISION  SUBMANDIBULAR GLAND EXCISION Right 05/17/2016   Procedure: INTRA ORAL EXCISION RIGHT SUBLINGUAL GLAND;  Surgeon: Serena Colonel, MD;  Location: [facility];  Service: ENT;  Laterality: Right;    MEDICATIONS:  acetaminophen (TYLENOL) 650 MG CR tablet   Ascorbic Acid (VITAMIN C PO)   atorvastatin (LIPITOR) 10 MG tablet   buPROPion (WELLBUTRIN SR) 200 MG 12 hr tablet   Cholecalciferol (VITAMIN D-3 PO)   clindamycin (CLEOCIN T) 1 % lotion   Cyanocobalamin (VITAMIN B-12) 5000 MCG SUBL   cyclobenzaprine (FLEXERIL) 10 MG tablet   famotidine-calcium carbonate-magnesium  hydroxide (PEPCID COMPLETE) 10-800-165 MG chewable tablet   montelukast (SINGULAIR) 10 MG tablet   ondansetron (ZOFRAN ODT) 4 MG disintegrating tablet   oxybutynin (DITROPAN-XL) 10 MG 24 hr tablet   pantoprazole (PROTONIX) 40 MG tablet   propranolol ER (INDERAL LA) 80 MG 24 hr capsule   sertraline (ZOLOFT) 25 MG tablet   No current facility-administered medications for this encounter.   Marcille Blanco [facility] Short Stay/Anesthesiology [facility] Phone [phone] 03/08/2024 9:21 AM

## 2024-03-09 ENCOUNTER — Ambulatory Visit (HOSPITAL_COMMUNITY)
Admission: RE | Admit: 2024-03-09 | Discharge: 2024-03-09 | Disposition: A | Discharge status: Home / Self Care | Attending: Surgery | Admitting: Surgery

## 2024-03-09 ENCOUNTER — Other Ambulatory Visit: Payer: Self-pay

## 2024-03-09 ENCOUNTER — Encounter (HOSPITAL_COMMUNITY)
Admission: RE | Disposition: A | Discharge status: Home / Self Care | Payer: Self-pay | Source: Home / Self Care | Attending: Surgery

## 2024-03-09 ENCOUNTER — Ambulatory Visit (HOSPITAL_COMMUNITY): Admitting: Medical

## 2024-03-09 ENCOUNTER — Encounter (HOSPITAL_COMMUNITY): Payer: Self-pay | Admitting: Surgery

## 2024-03-09 ENCOUNTER — Ambulatory Visit (HOSPITAL_BASED_OUTPATIENT_CLINIC_OR_DEPARTMENT_OTHER): Admitting: Medical

## 2024-03-09 DIAGNOSIS — F32A Depression, unspecified: Secondary | ICD-10-CM | POA: Insufficient documentation

## 2024-03-09 DIAGNOSIS — Z981 Arthrodesis status: Secondary | ICD-10-CM | POA: Insufficient documentation

## 2024-03-09 DIAGNOSIS — K4020 Bilateral inguinal hernia, without obstruction or gangrene, not specified as recurrent: Secondary | ICD-10-CM | POA: Diagnosis present

## 2024-03-09 DIAGNOSIS — G4730 Sleep apnea, unspecified: Secondary | ICD-10-CM | POA: Insufficient documentation

## 2024-03-09 DIAGNOSIS — Z01818 Encounter for other preprocedural examination: Secondary | ICD-10-CM

## 2024-03-09 DIAGNOSIS — F419 Anxiety disorder, unspecified: Secondary | ICD-10-CM | POA: Insufficient documentation

## 2024-03-09 DIAGNOSIS — K219 Gastro-esophageal reflux disease without esophagitis: Secondary | ICD-10-CM | POA: Insufficient documentation

## 2024-03-09 DIAGNOSIS — E871 Hypo-osmolality and hyponatremia: Secondary | ICD-10-CM

## 2024-03-09 DIAGNOSIS — I10 Essential (primary) hypertension: Secondary | ICD-10-CM | POA: Diagnosis not present

## 2024-03-09 HISTORY — PX: INGUINAL HERNIA REPAIR: SHX194

## 2024-03-09 LAB — POCT I-STAT, CHEM 8
BUN: 16 mg/dL (ref 6–20)
Calcium, Ion: 1.17 mmol/L (ref 1.15–1.40)
Chloride: 98 mmol/L (ref 98–111)
Creatinine, Ser: 0.9 mg/dL (ref 0.61–1.24)
Glucose, Bld: 98 mg/dL (ref 70–99)
HCT: 43 % (ref 39.0–52.0)
Hemoglobin: 14.6 g/dL (ref 13.0–17.0)
Potassium: 4.1 mmol/L (ref 3.5–5.1)
Sodium: 137 mmol/L (ref 135–145)
TCO2: 27 mmol/L (ref 22–32)

## 2024-03-09 SURGERY — REPAIR, HERNIA, INGUINAL, LAPAROSCOPIC
Anesthesia: General | Laterality: Bilateral

## 2024-03-09 MED ORDER — DEXAMETHASONE SODIUM PHOSPHATE 10 MG/ML IJ SOLN
INTRAMUSCULAR | Status: DC | PRN
  Administered 2024-03-09: 8 mg via INTRAVENOUS

## 2024-03-09 MED ORDER — LACTATED RINGERS IV SOLN
INTRAVENOUS | Status: DC
Start: 2024-03-09 — End: 2024-03-10

## 2024-03-09 MED ORDER — OXYCODONE-ACETAMINOPHEN 5-325 MG PO TABS: 1.0000 | ORAL_TABLET | ORAL | 0 refills | Status: AC | PRN

## 2024-03-09 MED ORDER — 0.9 % SODIUM CHLORIDE (POUR BTL) OPTIME
TOPICAL | Status: DC | PRN
  Administered 2024-03-09: 1000 mL

## 2024-03-09 MED ORDER — FENTANYL CITRATE (PF) 100 MCG/2ML IJ SOLN
INTRAMUSCULAR | Status: AC
  Filled 2024-03-09: qty 2

## 2024-03-09 MED ORDER — FENTANYL CITRATE (PF) 100 MCG/2ML IJ SOLN
INTRAMUSCULAR | Status: DC | PRN
  Administered 2024-03-09: 100 ug via INTRAVENOUS

## 2024-03-09 MED ORDER — CHLORHEXIDINE GLUCONATE CLOTH 2 % EX PADS
6.0000 | MEDICATED_PAD | Freq: Once | CUTANEOUS | Status: DC
Start: 2024-03-09 — End: 2024-03-10

## 2024-03-09 MED ORDER — CEFAZOLIN SODIUM-DEXTROSE 2-4 GM/100ML-% IV SOLN
2.0000 g | INTRAVENOUS | Status: AC
Start: 2024-03-09 — End: 2024-03-09
  Administered 2024-03-09: 2 g via INTRAVENOUS
  Filled 2024-03-09: qty 100

## 2024-03-09 MED ORDER — AMISULPRIDE (ANTIEMETIC) 5 MG/2ML IV SOLN: 10.0000 mg | Freq: Once | INTRAVENOUS | Status: DC | PRN

## 2024-03-09 MED ORDER — BUPIVACAINE LIPOSOME 1.3 % IJ SUSP
INTRAMUSCULAR | Status: AC
  Filled 2024-03-09: qty 20

## 2024-03-09 MED ORDER — OXYCODONE-ACETAMINOPHEN 5-325 MG PO TABS: 1.0000 | ORAL_TABLET | ORAL | 0 refills | Status: DC | PRN

## 2024-03-09 MED ORDER — OXYCODONE HCL 5 MG PO TABS
ORAL_TABLET | ORAL | Status: AC
  Administered 2024-03-09: 5 mg via ORAL
  Filled 2024-03-09: qty 1

## 2024-03-09 MED ORDER — SODIUM CHLORIDE 0.9 % IV SOLN: 12.5000 mg | INTRAVENOUS | Status: DC | PRN

## 2024-03-09 MED ORDER — PROPOFOL 10 MG/ML IV BOLUS
INTRAVENOUS | Status: DC | PRN
  Administered 2024-03-09: 200 mg via INTRAVENOUS

## 2024-03-09 MED ORDER — ONDANSETRON HCL 4 MG/2ML IJ SOLN
INTRAMUSCULAR | Status: DC | PRN
  Administered 2024-03-09: 4 mg via INTRAVENOUS

## 2024-03-09 MED ORDER — DEXAMETHASONE SODIUM PHOSPHATE 10 MG/ML IJ SOLN
INTRAMUSCULAR | Status: AC
  Filled 2024-03-09: qty 1

## 2024-03-09 MED ORDER — GABAPENTIN 300 MG PO CAPS
300.0000 mg | ORAL_CAPSULE | ORAL | Status: DC
  Filled 2024-03-09: qty 1

## 2024-03-09 MED ORDER — FENTANYL CITRATE PF 50 MCG/ML IJ SOSY
PREFILLED_SYRINGE | INTRAMUSCULAR | Status: AC
  Filled 2024-03-09: qty 1

## 2024-03-09 MED ORDER — GLYCOPYRROLATE 0.2 MG/ML IJ SOLN
INTRAMUSCULAR | Status: DC | PRN
Start: 2024-03-09 — End: 2024-03-09
  Administered 2024-03-09: .2 mg via INTRAVENOUS

## 2024-03-09 MED ORDER — MIDAZOLAM HCL 2 MG/2ML IJ SOLN
INTRAMUSCULAR | Status: AC
  Filled 2024-03-09: qty 2

## 2024-03-09 MED ORDER — FENTANYL CITRATE PF 50 MCG/ML IJ SOSY
PREFILLED_SYRINGE | INTRAMUSCULAR | Status: AC
  Administered 2024-03-09: 50 ug via INTRAVENOUS
  Filled 2024-03-09: qty 2

## 2024-03-09 MED ORDER — CHLORHEXIDINE GLUCONATE CLOTH 2 % EX PADS: 6.0000 | MEDICATED_PAD | Freq: Once | CUTANEOUS | Status: DC

## 2024-03-09 MED ORDER — OXYCODONE HCL 5 MG PO TABS: 5.0000 mg | ORAL_TABLET | Freq: Once | ORAL | Status: AC | PRN

## 2024-03-09 MED ORDER — HYDROMORPHONE HCL 1 MG/ML IJ SOLN
INTRAMUSCULAR | Status: AC
Start: 2024-03-09 — End: 2024-03-09
  Administered 2024-03-09: 0.5 mg via INTRAVENOUS
  Filled 2024-03-09: qty 1

## 2024-03-09 MED ORDER — PHENYLEPHRINE 80 MCG/ML (10ML) SYRINGE FOR IV PUSH (FOR BLOOD PRESSURE SUPPORT)
PREFILLED_SYRINGE | INTRAVENOUS | Status: AC
  Filled 2024-03-09: qty 10

## 2024-03-09 MED ORDER — ORAL CARE MOUTH RINSE: 15.0000 mL | Freq: Once | OROMUCOSAL | Status: AC

## 2024-03-09 MED ORDER — HYDROMORPHONE HCL 1 MG/ML IJ SOLN
0.2500 mg | INTRAMUSCULAR | Status: DC | PRN
  Administered 2024-03-09: 0.5 mg via INTRAVENOUS
  Administered 2024-03-09: 0.25 mg via INTRAVENOUS
  Administered 2024-03-09: 0.5 mg via INTRAVENOUS

## 2024-03-09 MED ORDER — SUGAMMADEX SODIUM 200 MG/2ML IV SOLN
INTRAVENOUS | Status: DC | PRN
  Administered 2024-03-09: 200 mg via INTRAVENOUS

## 2024-03-09 MED ORDER — ONDANSETRON HCL 4 MG/2ML IJ SOLN
INTRAMUSCULAR | Status: AC
  Filled 2024-03-09: qty 2

## 2024-03-09 MED ORDER — BUPIVACAINE-EPINEPHRINE (PF) 0.25% -1:200000 IJ SOLN
INTRAMUSCULAR | Status: DC | PRN
  Administered 2024-03-09: 50 mL

## 2024-03-09 MED ORDER — OXYCODONE HCL 5 MG/5ML PO SOLN: 5.0000 mg | Freq: Once | ORAL | Status: AC | PRN

## 2024-03-09 MED ORDER — HYDROMORPHONE HCL 1 MG/ML IJ SOLN
INTRAMUSCULAR | Status: AC
  Administered 2024-03-09: 0.25 mg via INTRAVENOUS
  Filled 2024-03-09: qty 1

## 2024-03-09 MED ORDER — EPHEDRINE SULFATE (PRESSORS) 50 MG/ML IJ SOLN
INTRAMUSCULAR | Status: DC | PRN
  Administered 2024-03-09: 5 mg via INTRAVENOUS

## 2024-03-09 MED ORDER — ROCURONIUM BROMIDE 100 MG/10ML IV SOLN
INTRAVENOUS | Status: DC | PRN
  Administered 2024-03-09: 50 mg via INTRAVENOUS

## 2024-03-09 MED ORDER — LIDOCAINE HCL (CARDIAC) PF 100 MG/5ML IV SOSY
PREFILLED_SYRINGE | INTRAVENOUS | Status: DC | PRN
  Administered 2024-03-09: 60 mg via INTRATRACHEAL

## 2024-03-09 MED ORDER — CHLORHEXIDINE GLUCONATE 0.12 % MT SOLN
15.0000 mL | Freq: Once | OROMUCOSAL | Status: AC
  Administered 2024-03-09: 15 mL via OROMUCOSAL

## 2024-03-09 MED ORDER — MIDAZOLAM HCL 5 MG/5ML IJ SOLN
INTRAMUSCULAR | Status: DC | PRN
  Administered 2024-03-09: 2 mg via INTRAVENOUS

## 2024-03-09 MED ORDER — ROCURONIUM BROMIDE 10 MG/ML (PF) SYRINGE
PREFILLED_SYRINGE | INTRAVENOUS | Status: AC
  Filled 2024-03-09: qty 10

## 2024-03-09 MED ORDER — BUPIVACAINE-EPINEPHRINE (PF) 0.25% -1:200000 IJ SOLN
INTRAMUSCULAR | Status: AC
  Filled 2024-03-09: qty 30

## 2024-03-09 MED ORDER — ACETAMINOPHEN 500 MG PO TABS
1000.0000 mg | ORAL_TABLET | Freq: Once | ORAL | Status: AC
  Administered 2024-03-09: 1000 mg via ORAL
  Filled 2024-03-09: qty 2

## 2024-03-09 MED ORDER — FENTANYL CITRATE PF 50 MCG/ML IJ SOSY
25.0000 ug | PREFILLED_SYRINGE | INTRAMUSCULAR | Status: DC | PRN
  Administered 2024-03-09 (×2): 50 ug via INTRAVENOUS

## 2024-03-09 SURGICAL SUPPLY — 35 items
BAG COUNTER SPONGE SURGICOUNT (BAG) ×1 IMPLANT
CABLE HIGH FREQUENCY MONO STRZ (ELECTRODE) ×1 IMPLANT
CHLORAPREP W/TINT 26 (MISCELLANEOUS) ×1 IMPLANT
COVER SURGICAL LIGHT HANDLE (MISCELLANEOUS) ×1 IMPLANT
DERMABOND ADVANCED .7 DNX12 (GAUZE/BANDAGES/DRESSINGS) ×1 IMPLANT
ELECT REM PT RETURN 15FT ADLT (MISCELLANEOUS) ×1 IMPLANT
GLOVE BIO SURGEON STRL SZ7.5 (GLOVE) ×1 IMPLANT
GLOVE INDICATOR 8.0 STRL GRN (GLOVE) ×1 IMPLANT
GOWN STRL REUS W/ TWL XL LVL3 (GOWN DISPOSABLE) ×1 IMPLANT
GRASPER SUT TROCAR 14GX15 (MISCELLANEOUS) ×1 IMPLANT
IRRIG SUCT STRYKERFLOW 2 WTIP (MISCELLANEOUS) IMPLANT
IRRIGATION SUCT STRKRFLW 2 WTP (MISCELLANEOUS) IMPLANT
KIT BASIN OR (CUSTOM PROCEDURE TRAY) ×1 IMPLANT
KIT TURNOVER KIT A (KITS) IMPLANT
MARKER SKIN DUAL TIP RULER LAB (MISCELLANEOUS) ×1 IMPLANT
MESH 3DMAX MID 5X7 LT XLRG (Mesh General) IMPLANT
MESH 3DMAX MID 5X7 RT XLRG (Mesh General) IMPLANT
NDL INSUFFLATION 14GA 120MM (NEEDLE) ×1 IMPLANT
NEEDLE INSUFFLATION 14GA 120MM (NEEDLE) ×1 IMPLANT
RELOAD STAPLE 4.0 BLU F/HERNIA (INSTRUMENTS) ×1 IMPLANT
RELOAD STAPLE 4.8 BLK F/HERNIA (STAPLE) IMPLANT
RELOAD STAPLE HERNIA 4.0 BLUE (INSTRUMENTS) ×1 IMPLANT
RELOAD STAPLE HERNIA 4.8 BLK (STAPLE) ×1 IMPLANT
SCISSORS LAP 5X35 DISP (ENDOMECHANICALS) ×1 IMPLANT
SET TUBE SMOKE EVAC HIGH FLOW (TUBING) ×1 IMPLANT
SLEEVE Z-THREAD 5X100MM (TROCAR) ×1 IMPLANT
SPIKE FLUID TRANSFER (MISCELLANEOUS) IMPLANT
STAPLER HERNIA 12 8.5 360D (INSTRUMENTS) ×1 IMPLANT
SUT MNCRL AB 4-0 PS2 18 (SUTURE) ×1 IMPLANT
SUT VICRYL 0 UR6 27IN ABS (SUTURE) IMPLANT
TOWEL OR 17X26 10 PK STRL BLUE (TOWEL DISPOSABLE) ×1 IMPLANT
TRAY FOL W/BAG SLVR 16FR STRL (SET/KITS/TRAYS/PACK) IMPLANT
TRAY LAPAROSCOPIC (CUSTOM PROCEDURE TRAY) ×1 IMPLANT
TROCAR ADV FIXATION 12X100MM (TROCAR) ×1 IMPLANT
TROCAR Z-THREAD FIOS 5X100MM (TROCAR) ×1 IMPLANT

## 2024-03-09 NOTE — Anesthesia Postprocedure Evaluation (Signed)
 Anesthesia Post Note  Patient: Jim Smith  Procedure(s) Performed: LAPAROSCOPIC BILATERAL INGUINAL HERNIA WITH MESH (Bilateral)     Patient location during evaluation: PACU Anesthesia Type: General Level of consciousness: awake and alert Pain management: pain level controlled Vital Signs Assessment: post-procedure vital signs reviewed and stable Respiratory status: spontaneous breathing, nonlabored ventilation and respiratory function stable Cardiovascular status: blood pressure returned to baseline and stable Postop Assessment: no apparent nausea or vomiting Anesthetic complications: no   No notable events documented.  Last Vitals:  Vitals:   03/09/24 1700 03/09/24 1714  BP: (!) 149/97   Pulse: (!) 59 (!) 59  Resp: 11   Temp:  (!) 36.4 C  SpO2: 97% 99%    Last Pain:  Vitals:   03/09/24 1700  TempSrc:   PainSc: 4                  Beryle Lathe

## 2024-03-09 NOTE — Transfer of Care (Signed)
 Immediate Anesthesia Transfer of Care Note  Patient: Jim Smith  Procedure(s) Performed: LAPAROSCOPIC BILATERAL INGUINAL HERNIA WITH MESH (Bilateral)  Patient Location: PACU  Anesthesia Type:General  Level of Consciousness: awake, drowsy, and patient cooperative  Airway & Oxygen Therapy: Patient Spontanous Breathing and Patient connected to face mask oxygen  Post-op Assessment: Report given to RN and Post -op Vital signs reviewed and stable  Post vital signs: stable  Last Vitals:  Vitals Value Taken Time  BP    Temp    Pulse 55 03/09/24 1453  Resp 16 03/09/24 1453  SpO2 100 % 03/09/24 1453  Vitals shown include unfiled device data.  Last Pain:  Vitals:   03/09/24 1111  TempSrc: Oral  PainSc:          Complications: No notable events documented.

## 2024-03-09 NOTE — Discharge Instructions (Signed)

## 2024-03-09 NOTE — Op Note (Signed)
 Patient: Jim Smith (1966-06-16, [id number])  Date of Surgery: 03/09/2024  Preoperative Diagnosis: BILATERAL INGUINAL HERNIA   Postoperative Diagnosis: BILATERAL INGUINAL HERNIA   Surgical Procedure: LAPAROSCOPIC BILATERAL INGUINAL HERNIA WITH MESH: [id number]   Operative Team Members:  Surgeons and Role:    * Sendy Pluta, Hyman Hopes, MD - Primary   Anesthesiologist: Beryle Lathe, MD CRNA: Donna Bernard, CRNA   Anesthesia: General   Fluids:  Total I/O In: 950 [I.V.:950] Out: -   Complications: None  Drains:  None  Specimen: None  Disposition:  PACU - hemodynamically stable.  Plan of Care: Discharge to home after PACU  Indications for Procedure: Kaliel Bolds is a 58 y.o. male who presented with bilateral inguinal hernias.  I recommended laparoscopic repair.  We discussed the procedure, its risks, benefits and alternatives. After a full discussion and all questions answered the patient granted consent to proceed.  Findings:  Technique: Transabdominal preperitoneal (TAPP) Hernia Location: bilateral direct inguinal hernias Mesh Size &Type:  Bard 3D Max Mid Weight bilateral meshes Mesh Fixation: Endo-Universal hernia stapler  Infection status: Patient: Private Patient Elective Case Case: Elective Infection Present At Time Of Surgery (PATOS): None   Description of Procedure:  The patient was positioned supine, padded and secured to the bed, with both arms tucked.  The abdomen was widely prepped and draped.  A time out procedure was performed.  A 1 cm infraumbilical incision was made.  The abdomen was entered without trauma to the underlying viscera.  The abdomen was insufflated to 15 mm of Hg.  A 12 mm trocar was inserted at the periumbilical incision.  Additional 5 mm trocars were placed in the left and right abdomen.  There was no trauma to the underlying viscera.  There was a direct hernia on the RIGHT.  Utilizing a transabdominal pre peritoneal technique (TAPP), a  horizontal incision was made in the peritoneum, immediately below the umbilicus.  Dissection was carried out in the pre peritoneal space down to the level of the hernia sac which was reduced into the peritoneal cavity completely.  The cord contents were parietalized and preserved.  A large pre peritoneal dissection was performed to uncover the direct, indirect, femoral and obturator spaces.  Cooper's ligament was uncovered medially and the psoas muscle uncovered laterally.  The mesh, as documented above, was opened and advanced into the pre peritoneal position so that it more than adequately covered the indirect, direct, femoral and obturator spaces.  The mesh laid flat, with no inferior folds and covered the entire myopectineal orifice.  The mesh was fixated with the endo-universal hernia stapler to Cooper's ligament and the posterior aspect of the rectus muscle.  The peritoneal flap was closed with the same device.  There were no peritoneal defects or exposed mesh at the conclusion.  There was an direct hernia on the LEFT.  Utilizing a transabdominal pre peritoneal technique (TAPP), a horizontal incision was made in the peritoneum, immediately below the umbilicus.  Dissection was carried out in the pre peritoneal space down to the level of the hernia sac which was reduced into the peritoneal cavity completely.  The cord contents were parietalized and preserved.  A large pre peritoneal dissection was performed to uncover the direct, indirect, femoral and obturator spaces.  Cooper's ligament was uncovered medially and the psoas muscle uncovered laterally.  The mesh, as documented above, was opened and advanced into the pre peritoneal position so that it more than adequately covered the indirect, direct, femoral and obturator spaces.  The mesh laid flat, with no inferior folds and covered the entire myopectineal orifice.  The mesh was fixated with the endo-universal hernia stapler to Cooper's ligament and the  posterior aspect of the rectus muscle.  The peritoneal flap was closed with the same device.  There were no peritoneal defects or exposed mesh at the conclusion.  The umbilical trocar was removed and the fascial defect was closed with a 0 Vicryl suture.  The peritoneal cavity was completely desufflated, the trocars removed and the skin closed with 4-0 Monocryl subcuticular suture and skin glue.  All sponge and needle counts were correct at the end of the case.  Ivar Drape, MD General, Bariatric, & Minimally Invasive Surgery [facility], [place]

## 2024-03-09 NOTE — H&P (Signed)
 Admitting Physician: Hyman Hopes Seann Genther  Service: General Surgery  CC: Hernia  Subjective   HPI: Jim Smith is an 58 y.o. male who is here for bilateral inguinal hernia repair.  Past Medical History:  Diagnosis Date   Acute ear infection    Anxiety    Bronchitis    Depression    GERD (gastroesophageal reflux disease)    H/O removal of neck cyst 01/2012   R anterior neck I&D, with abx   Hyperlipidemia    Hypertension    Migraine    Sleep apnea    uses Bipap occaisionally    Past Surgical History:  Procedure Laterality Date   ANTERIOR CERVICAL DECOMP/DISCECTOMY FUSION N/A 10/01/2023   Procedure: ANTERIOR CERVICAL DECOMPRESSION FUSION CERVICAL 5- CERVICAL 6, CERVICAL 6- CERVICAL 7 WITH INSTRUMENTATION AND ALLOGRAFT;  Surgeon: Estill Bamberg, MD;  Location[facility];  Service: Orthopedics;  Laterality: N/A;   DRUG INDUCED ENDOSCOPY N/A 11/01/2020   Procedure: DRUG INDUCED ENDOSCOPY;  Surgeon: Christia Reading, MD;  Location: [facility];  Service: ENT;  Laterality: N/A;   FRACTURE SURGERY Left 2009   tibial fx   IMPLANTATION OF HYPOGLOSSAL NERVE STIMULATOR Right 07/04/2021   Procedure: IMPLANTATION OF HYPOGLOSSAL NERVE STIMULATOR;  Surgeon: Christia Reading, MD;  Location: [facility];  Service: ENT;  Laterality: Right;   PILONIDAL CYST EXCISION     SUBMANDIBULAR GLAND EXCISION Right 05/17/2016   Procedure: INTRA ORAL EXCISION RIGHT SUBLINGUAL GLAND;  Surgeon: Serena Colonel, MD;  Location: [facility];  Service: ENT;  Laterality: Right;    Family History  Problem Relation Age of Onset   Parkinson's disease Mother    Depression Father    Liver cancer Maternal Grandfather     Social:  reports that he has never smoked. He has never used smokeless tobacco. He reports that he does not currently use alcohol. He reports that he does not use drugs.  Allergies:  Allergies  Allergen Reactions   Bactrim [Sulfamethoxazole-Trimethoprim] Rash    Buprenorphine Hcl Other (See Comments)    GI Intolerance   Voltaren [Diclofenac] Diarrhea and Other (See Comments)    GI upset   Dm-Doxylamine-Acetaminophen Rash   Morphine And Codeine Nausea And Vomiting    Patient denies allergy   Penicillins Rash   Tamiflu [Oseltamivir] Rash    Medications: Current Outpatient Medications  Medication Instructions   acetaminophen (TYLENOL) 1,300 mg, Every 8 hours PRN   Ascorbic Acid (VITAMIN C PO) 1 tablet, 2 times weekly   atorvastatin (LIPITOR) 10 mg, Daily at bedtime   buPROPion (WELLBUTRIN SR) 200 mg, 2 times daily   Cholecalciferol (VITAMIN D-3 PO) 1 tablet, 2 times weekly   clindamycin (CLEOCIN T) 1 % lotion 1 Application, Daily PRN   cyclobenzaprine (FLEXERIL) 10 mg, Daily at bedtime   famotidine-calcium carbonate-magnesium hydroxide (PEPCID COMPLETE) 10-800-165 MG chewable tablet 1 tablet, Daily PRN   montelukast (SINGULAIR) 10 mg, Daily at bedtime   ondansetron (ZOFRAN ODT) 4 mg, Oral, Every 8 hours PRN   oxybutynin (DITROPAN-XL) 10 mg, Every morning   pantoprazole (PROTONIX) 40 mg, 2 times daily   propranolol ER (INDERAL LA) 80 mg, Every morning   sertraline (ZOLOFT) 25-50 mg, See admin instructions   Vitamin B-12 5,000 mcg, Daily    ROS - all of the below systems have been reviewed with the patient and positives are indicated with bold text General: chills, fever or night sweats Eyes: blurry vision or double vision ENT: epistaxis or sore throat  Allergy/Immunology: itchy/watery eyes or nasal congestion Hematologic/Lymphatic: bleeding problems, blood clots or swollen lymph nodes Endocrine: temperature intolerance or unexpected weight changes Breast: new or changing breast lumps or nipple discharge Resp: cough, shortness of breath, or wheezing CV: chest pain or dyspnea on exertion GI: as per HPI GU: dysuria, trouble voiding, or hematuria MSK: joint pain or joint stiffness Neuro: TIA or stroke symptoms Derm: pruritus and skin  lesion changes Psych: anxiety and depression  Objective   PE Blood pressure (!) 143/86, pulse (!) 55, temperature 97.7 F (36.5 C), temperature source Oral, resp. rate 16, height 6\' 2"  (1.88 m), weight 78.9 kg, SpO2 97%. Constitutional: NAD; conversant; no deformities Eyes: Moist conjunctiva; no lid lag; anicteric; PERRL Neck: Trachea midline; no thyromegaly Lungs: Normal respiratory effort; no tactile fremitus CV: RRR; no palpable thrills; no pitting edema GI: Abd Bilateral inguinal hernia; no palpable hepatosplenomegaly MSK: Normal range of motion of extremities; no clubbing/cyanosis Psychiatric: Appropriate affect; alert and oriented x3 Lymphatic: No palpable cervical or axillary lymphadenopathy  No results found for this or any previous visit (from the past 24 hours).  Imaging Orders  No imaging studies ordered today  CT Abd/Pel 03/07/22 1. Small bilateral direct inguinal hernias contain adipose tissue. 2. Prominent stool throughout the colon favors constipation. 3. Scattered diverticula in the proximal sigmoid colon without findings of active diverticulitis.    Assessment and Plan   Jim Smith is an 58 y.o. male with Non-recurrent bilateral inguinal hernia without obstruction or gangrene   Recommend a laparoscopic bilateral inguinal hernia repair with mesh. We discussed the procedure itself as well as its risk, benefits, and alternatives. After full discussion all questions answered the patient agreed consent to proceed.     Quentin Ore, MD  [facility] Use [email or web address] to contact on call provider

## 2024-03-09 NOTE — Anesthesia Procedure Notes (Signed)
 Procedure Name: Intubation Date/Time: 03/09/2024 1:41 PM  Performed by: Donna Bernard, CRNAPre-anesthesia Checklist: Patient identified, Emergency Drugs available, Suction available, Patient being monitored and Timeout performed Patient Re-evaluated:Patient Re-evaluated prior to induction Oxygen Delivery Method: Circle system utilized Preoxygenation: Pre-oxygenation with 100% oxygen Induction Type: IV induction Ventilation: Mask ventilation without difficulty Laryngoscope Size: Miller and 2 Grade View: Grade II Tube type: Oral Tube size: 7.5 mm Number of attempts: 1 Airway Equipment and Method: Stylet Placement Confirmation: positive ETCO2, ETT inserted through vocal cords under direct vision, CO2 detector and breath sounds checked- equal and bilateral Secured at: 23 cm Tube secured with: Tape Dental Injury: Teeth and Oropharynx as per pre-operative assessment

## 2024-03-09 NOTE — Anesthesia Preprocedure Evaluation (Addendum)
 Anesthesia Evaluation  Patient identified by MRN, date of birth, ID band Patient awake    Reviewed: Allergy & Precautions, NPO status , Patient's Chart, lab work & pertinent test results, reviewed documented beta blocker date and time   History of Anesthesia Complications Negative for: history of anesthetic complications  Airway Mallampati: II  TM Distance: >3 FB Neck ROM: Full    Dental  (+) Dental Advisory Given   Pulmonary sleep apnea and Continuous Positive Airway Pressure Ventilation    Pulmonary exam normal        Cardiovascular hypertension, Pt. on medications and Pt. on home beta blockers Normal cardiovascular exam     Neuro/Psych  Headaches PSYCHIATRIC DISORDERS Anxiety Depression     S/p cervical fusion     GI/Hepatic Neg liver ROS,GERD  Medicated and Controlled,,  Endo/Other   Na 129 (chronic hyponatremia)   Renal/GU negative Renal ROS     Musculoskeletal negative musculoskeletal ROS (+)    Abdominal   Peds  Hematology negative hematology ROS (+)   Anesthesia Other Findings   Reproductive/Obstetrics                              Anesthesia Physical Anesthesia Plan  ASA: 2  Anesthesia Plan: General   Post-op Pain Management: Tylenol PO (pre-op)*   Induction: Intravenous  PONV Risk Score and Plan: 2 and Treatment may vary due to age or medical condition, Ondansetron, Dexamethasone and Midazolam  Airway Management Planned: Oral ETT  Additional Equipment: None  Intra-op Plan:   Post-operative Plan: Extubation in OR  Informed Consent: I have reviewed the patients History and Physical, chart, labs and discussed the procedure including the risks, benefits and alternatives for the proposed anesthesia with the patient or authorized representative who has indicated his/her understanding and acceptance.     Dental advisory given  Plan Discussed with: CRNA and  Anesthesiologist  Anesthesia Plan Comments:          Anesthesia Quick Evaluation

## 2024-03-10 ENCOUNTER — Encounter (HOSPITAL_COMMUNITY): Payer: Self-pay | Admitting: Surgery

## 2024-07-27 NOTE — Procedures (Signed)
Result scanned to media
